# Patient Record
Sex: Female | Born: 1935 | Race: White | Hispanic: No | Smoking: Former smoker
Health system: Southern US, Community
[De-identification: ages and names within clinical notes are randomized; demographics above are authoritative.]

## PROBLEM LIST (undated history)

## (undated) DIAGNOSIS — I10 Essential (primary) hypertension: Secondary | ICD-10-CM

## (undated) DIAGNOSIS — T8859XA Other complications of anesthesia, initial encounter: Secondary | ICD-10-CM

## (undated) DIAGNOSIS — I4891 Unspecified atrial fibrillation: Secondary | ICD-10-CM

## (undated) DIAGNOSIS — I509 Heart failure, unspecified: Secondary | ICD-10-CM

## (undated) DIAGNOSIS — I639 Cerebral infarction, unspecified: Secondary | ICD-10-CM

## (undated) DIAGNOSIS — M069 Rheumatoid arthritis, unspecified: Secondary | ICD-10-CM

## (undated) HISTORY — PX: JOINT REPLACEMENT: SHX530

---

## 2020-10-04 ENCOUNTER — Emergency Department (HOSPITAL_COMMUNITY): Payer: Medicare PPO

## 2020-10-04 ENCOUNTER — Other Ambulatory Visit: Payer: Self-pay

## 2020-10-04 ENCOUNTER — Inpatient Hospital Stay (HOSPITAL_COMMUNITY)
Admission: EM | Admit: 2020-10-04 | Discharge: 2020-10-08 | DRG: 521 | Disposition: A | Discharge status: Home Health | Payer: Medicare PPO | Attending: Internal Medicine | Admitting: Internal Medicine

## 2020-10-04 ENCOUNTER — Encounter (HOSPITAL_COMMUNITY): Payer: Self-pay

## 2020-10-04 DIAGNOSIS — D649 Anemia, unspecified: Secondary | ICD-10-CM | POA: Diagnosis not present

## 2020-10-04 DIAGNOSIS — Z8673 Personal history of transient ischemic attack (TIA), and cerebral infarction without residual deficits: Secondary | ICD-10-CM

## 2020-10-04 DIAGNOSIS — Z20822 Contact with and (suspected) exposure to covid-19: Secondary | ICD-10-CM | POA: Diagnosis present

## 2020-10-04 DIAGNOSIS — I447 Left bundle-branch block, unspecified: Secondary | ICD-10-CM | POA: Diagnosis present

## 2020-10-04 DIAGNOSIS — Z419 Encounter for procedure for purposes other than remedying health state, unspecified: Secondary | ICD-10-CM

## 2020-10-04 DIAGNOSIS — W010XXA Fall on same level from slipping, tripping and stumbling without subsequent striking against object, initial encounter: Secondary | ICD-10-CM | POA: Diagnosis present

## 2020-10-04 DIAGNOSIS — H9190 Unspecified hearing loss, unspecified ear: Secondary | ICD-10-CM | POA: Diagnosis present

## 2020-10-04 DIAGNOSIS — Z974 Presence of external hearing-aid: Secondary | ICD-10-CM

## 2020-10-04 DIAGNOSIS — I110 Hypertensive heart disease with heart failure: Secondary | ICD-10-CM | POA: Diagnosis present

## 2020-10-04 DIAGNOSIS — Z87891 Personal history of nicotine dependence: Secondary | ICD-10-CM

## 2020-10-04 DIAGNOSIS — S72001A Fracture of unspecified part of neck of right femur, initial encounter for closed fracture: Principal | ICD-10-CM | POA: Diagnosis present

## 2020-10-04 DIAGNOSIS — I509 Heart failure, unspecified: Secondary | ICD-10-CM | POA: Diagnosis present

## 2020-10-04 DIAGNOSIS — M069 Rheumatoid arthritis, unspecified: Secondary | ICD-10-CM | POA: Diagnosis present

## 2020-10-04 DIAGNOSIS — W1830XA Fall on same level, unspecified, initial encounter: Secondary | ICD-10-CM | POA: Diagnosis present

## 2020-10-04 DIAGNOSIS — K5730 Diverticulosis of large intestine without perforation or abscess without bleeding: Secondary | ICD-10-CM | POA: Diagnosis present

## 2020-10-04 DIAGNOSIS — S0003XA Contusion of scalp, initial encounter: Secondary | ICD-10-CM | POA: Diagnosis present

## 2020-10-04 DIAGNOSIS — R296 Repeated falls: Secondary | ICD-10-CM | POA: Diagnosis present

## 2020-10-04 DIAGNOSIS — Z79899 Other long term (current) drug therapy: Secondary | ICD-10-CM

## 2020-10-04 DIAGNOSIS — E43 Unspecified severe protein-calorie malnutrition: Secondary | ICD-10-CM | POA: Diagnosis present

## 2020-10-04 DIAGNOSIS — Z885 Allergy status to narcotic agent status: Secondary | ICD-10-CM

## 2020-10-04 DIAGNOSIS — M1611 Unilateral primary osteoarthritis, right hip: Secondary | ICD-10-CM | POA: Diagnosis present

## 2020-10-04 DIAGNOSIS — I480 Paroxysmal atrial fibrillation: Secondary | ICD-10-CM | POA: Diagnosis present

## 2020-10-04 DIAGNOSIS — Z91013 Allergy to seafood: Secondary | ICD-10-CM

## 2020-10-04 DIAGNOSIS — Z681 Body mass index (BMI) 19 or less, adult: Secondary | ICD-10-CM

## 2020-10-04 DIAGNOSIS — Z96641 Presence of right artificial hip joint: Secondary | ICD-10-CM | POA: Diagnosis not present

## 2020-10-04 DIAGNOSIS — I482 Chronic atrial fibrillation, unspecified: Secondary | ICD-10-CM | POA: Diagnosis present

## 2020-10-04 DIAGNOSIS — I10 Essential (primary) hypertension: Secondary | ICD-10-CM | POA: Diagnosis present

## 2020-10-04 DIAGNOSIS — S0101XA Laceration without foreign body of scalp, initial encounter: Secondary | ICD-10-CM | POA: Diagnosis present

## 2020-10-04 HISTORY — DX: Essential (primary) hypertension: I10

## 2020-10-04 HISTORY — DX: Rheumatoid arthritis, unspecified: M06.9

## 2020-10-04 HISTORY — DX: Unspecified atrial fibrillation: I48.91

## 2020-10-04 HISTORY — DX: Cerebral infarction, unspecified: I63.9

## 2020-10-04 HISTORY — DX: Heart failure, unspecified: I50.9

## 2020-10-04 HISTORY — DX: Other complications of anesthesia, initial encounter: T88.59XA

## 2020-10-04 MED ORDER — ACETAMINOPHEN 325 MG PO TABS
650.0000 mg | ORAL_TABLET | Freq: Once | ORAL | Status: AC
  Administered 2020-10-04: 650 mg via ORAL
  Filled 2020-10-04: qty 2

## 2020-10-04 NOTE — ED Triage Notes (Signed)
Pt had witnessed mechanical fall while trying to remove her shoes. Hit her head. Denies LOC. Laceration on back of head.  Pt was ambulatory when EMS arrived.

## 2020-10-04 NOTE — ED Notes (Signed)
Patient transported to X-ray 

## 2020-10-05 ENCOUNTER — Emergency Department (HOSPITAL_COMMUNITY): Payer: Medicare PPO

## 2020-10-05 ENCOUNTER — Encounter (HOSPITAL_COMMUNITY): Payer: Self-pay | Admitting: Internal Medicine

## 2020-10-05 DIAGNOSIS — S0003XA Contusion of scalp, initial encounter: Secondary | ICD-10-CM | POA: Diagnosis not present

## 2020-10-05 DIAGNOSIS — E43 Unspecified severe protein-calorie malnutrition: Secondary | ICD-10-CM | POA: Diagnosis present

## 2020-10-05 DIAGNOSIS — Z91013 Allergy to seafood: Secondary | ICD-10-CM | POA: Diagnosis not present

## 2020-10-05 DIAGNOSIS — R296 Repeated falls: Secondary | ICD-10-CM | POA: Diagnosis present

## 2020-10-05 DIAGNOSIS — M069 Rheumatoid arthritis, unspecified: Secondary | ICD-10-CM | POA: Diagnosis present

## 2020-10-05 DIAGNOSIS — I10 Essential (primary) hypertension: Secondary | ICD-10-CM

## 2020-10-05 DIAGNOSIS — I110 Hypertensive heart disease with heart failure: Secondary | ICD-10-CM | POA: Diagnosis present

## 2020-10-05 DIAGNOSIS — Z79899 Other long term (current) drug therapy: Secondary | ICD-10-CM | POA: Diagnosis not present

## 2020-10-05 DIAGNOSIS — Z20822 Contact with and (suspected) exposure to covid-19: Secondary | ICD-10-CM | POA: Diagnosis present

## 2020-10-05 DIAGNOSIS — I480 Paroxysmal atrial fibrillation: Secondary | ICD-10-CM | POA: Diagnosis present

## 2020-10-05 DIAGNOSIS — W1830XA Fall on same level, unspecified, initial encounter: Secondary | ICD-10-CM

## 2020-10-05 DIAGNOSIS — S72001A Fracture of unspecified part of neck of right femur, initial encounter for closed fracture: Secondary | ICD-10-CM | POA: Diagnosis present

## 2020-10-05 DIAGNOSIS — I447 Left bundle-branch block, unspecified: Secondary | ICD-10-CM | POA: Diagnosis present

## 2020-10-05 DIAGNOSIS — Z87891 Personal history of nicotine dependence: Secondary | ICD-10-CM | POA: Diagnosis not present

## 2020-10-05 DIAGNOSIS — Z8673 Personal history of transient ischemic attack (TIA), and cerebral infarction without residual deficits: Secondary | ICD-10-CM | POA: Diagnosis not present

## 2020-10-05 DIAGNOSIS — I482 Chronic atrial fibrillation, unspecified: Secondary | ICD-10-CM | POA: Diagnosis not present

## 2020-10-05 DIAGNOSIS — Z681 Body mass index (BMI) 19 or less, adult: Secondary | ICD-10-CM | POA: Diagnosis not present

## 2020-10-05 DIAGNOSIS — W010XXA Fall on same level from slipping, tripping and stumbling without subsequent striking against object, initial encounter: Secondary | ICD-10-CM | POA: Diagnosis present

## 2020-10-05 DIAGNOSIS — S0101XA Laceration without foreign body of scalp, initial encounter: Secondary | ICD-10-CM | POA: Diagnosis present

## 2020-10-05 DIAGNOSIS — M1611 Unilateral primary osteoarthritis, right hip: Secondary | ICD-10-CM | POA: Diagnosis present

## 2020-10-05 DIAGNOSIS — D649 Anemia, unspecified: Secondary | ICD-10-CM | POA: Diagnosis not present

## 2020-10-05 DIAGNOSIS — Z974 Presence of external hearing-aid: Secondary | ICD-10-CM | POA: Diagnosis not present

## 2020-10-05 DIAGNOSIS — H9190 Unspecified hearing loss, unspecified ear: Secondary | ICD-10-CM | POA: Diagnosis present

## 2020-10-05 DIAGNOSIS — I509 Heart failure, unspecified: Secondary | ICD-10-CM | POA: Diagnosis present

## 2020-10-05 DIAGNOSIS — Z885 Allergy status to narcotic agent status: Secondary | ICD-10-CM | POA: Diagnosis not present

## 2020-10-05 DIAGNOSIS — K5730 Diverticulosis of large intestine without perforation or abscess without bleeding: Secondary | ICD-10-CM | POA: Diagnosis present

## 2020-10-05 LAB — CBC WITH DIFFERENTIAL/PLATELET
Abs Immature Granulocytes: 0.01 10*3/uL (ref 0.00–0.07)
Basophils Absolute: 0 10*3/uL (ref 0.0–0.1)
Basophils Relative: 0 %
Eosinophils Absolute: 0 10*3/uL (ref 0.0–0.5)
Eosinophils Relative: 0 %
HCT: 36.1 % (ref 36.0–46.0)
Hemoglobin: 11.6 g/dL — ABNORMAL LOW (ref 12.0–15.0)
Immature Granulocytes: 0 %
Lymphocytes Relative: 29 %
Lymphs Abs: 2.1 10*3/uL (ref 0.7–4.0)
MCH: 31.4 pg (ref 26.0–34.0)
MCHC: 32.1 g/dL (ref 30.0–36.0)
MCV: 97.8 fL (ref 80.0–100.0)
Monocytes Absolute: 0.5 10*3/uL (ref 0.1–1.0)
Monocytes Relative: 6 %
Neutro Abs: 4.8 10*3/uL (ref 1.7–7.7)
Neutrophils Relative %: 65 %
Platelets: 175 10*3/uL (ref 150–400)
RBC: 3.69 MIL/uL — ABNORMAL LOW (ref 3.87–5.11)
RDW: 14.7 % (ref 11.5–15.5)
WBC: 7.4 10*3/uL (ref 4.0–10.5)
nRBC: 0 % (ref 0.0–0.2)

## 2020-10-05 LAB — RESP PANEL BY RT-PCR (FLU A&B, COVID) ARPGX2
Influenza A by PCR: NEGATIVE
Influenza B by PCR: NEGATIVE
SARS Coronavirus 2 by RT PCR: NEGATIVE

## 2020-10-05 LAB — BASIC METABOLIC PANEL
Anion gap: 11 (ref 5–15)
BUN: 29 mg/dL — ABNORMAL HIGH (ref 8–23)
CO2: 26 mmol/L (ref 22–32)
Calcium: 8.4 mg/dL — ABNORMAL LOW (ref 8.9–10.3)
Chloride: 100 mmol/L (ref 98–111)
Creatinine, Ser: 0.84 mg/dL (ref 0.44–1.00)
GFR, Estimated: >60 mL/min (ref >60)
Glucose, Bld: 122 mg/dL — ABNORMAL HIGH (ref 70–99)
Potassium: 4.5 mmol/L (ref 3.5–5.1)
Sodium: 137 mmol/L (ref 135–145)

## 2020-10-05 LAB — PROTIME-INR
INR: 1.1 (ref 0.8–1.2)
Prothrombin Time: 14 seconds (ref 11.4–15.2)

## 2020-10-05 MED ORDER — ONDANSETRON HCL 4 MG/2ML IJ SOLN: 4.0000 mg | Freq: Four times a day (QID) | INTRAMUSCULAR | Status: DC | PRN

## 2020-10-05 MED ORDER — ATORVASTATIN CALCIUM 20 MG PO TABS
20.0000 mg | ORAL_TABLET | Freq: Every day | ORAL | Status: DC
  Administered 2020-10-05 – 2020-10-07 (×3): 20 mg via ORAL
  Filled 2020-10-05 (×3): qty 1

## 2020-10-05 MED ORDER — MIRTAZAPINE 15 MG PO TABS
7.5000 mg | ORAL_TABLET | Freq: Every day | ORAL | Status: DC
  Administered 2020-10-05 – 2020-10-07 (×3): 7.5 mg via ORAL
  Filled 2020-10-05 (×3): qty 1

## 2020-10-05 MED ORDER — ACETAMINOPHEN 650 MG RE SUPP: 650.0000 mg | Freq: Four times a day (QID) | RECTAL | Status: DC | PRN

## 2020-10-05 MED ORDER — ONDANSETRON HCL 4 MG PO TABS: 4.0000 mg | ORAL_TABLET | Freq: Four times a day (QID) | ORAL | Status: DC | PRN

## 2020-10-05 MED ORDER — ACETAMINOPHEN 325 MG PO TABS: 650.0000 mg | ORAL_TABLET | Freq: Four times a day (QID) | ORAL | Status: DC | PRN

## 2020-10-05 MED ORDER — METOPROLOL TARTRATE 50 MG PO TABS
100.0000 mg | ORAL_TABLET | Freq: Two times a day (BID) | ORAL | Status: DC
  Administered 2020-10-05 (×2): 100 mg via ORAL
  Filled 2020-10-05 (×3): qty 2

## 2020-10-05 NOTE — H&P (Signed)
History and Physical  Patient Name: Kristen Lamb     RUE:[id number]    DOB: 1929-09-29    DOA: 10/04/2020 PCP: No primary care provider on file.  Patient coming from: Home  Chief Complaint: Fall    HPI: Kristen Lamb is a 87 y.o. female, with PMH of RA, hypertension, CVA, paroxysmal A. fib (not on anticoagulation), CHF, who presented to the ER on 10/05/2020 with fall and was found to have head laceration and right hip fracture.  Patient was at her daughter's house when she had a mechanical fall, falling backwards while trying to remove her shoe and hit her head and landed on the right side of her body.  She has had a few falls over the past several months.  She had bleeding in the back of her head.  Denies loss of consciousness.  She is not on anticoagulation for A. fib.  It appears she is possibly on Plavix. She lives in [place] but was visiting her daughter as her daughter has been having some mental issues.  She was able to walk to walk after the incident.  No significant pain.  Of note she is follow-up with an orthopedic in her hometown for arthritis and there was tentative plans for possible hip replacement.  Due to fall and bleeding from the back of her head, she presented to the ER for evaluation.    ED course: -Vitals on admission: Heart rate 68, respiratory rate 17, blood pressure 173/75, maintaining sats on room air -Labs on initial presentation: Sodium 137, potassium 4.5, chloride 100, bicarb 26, glucose 122, BUN 29, creatinine 0.84, calcium 8.4, hemoglobin 11.6, WBC 7.4 -Imaging obtained on admission:  Imaging demonstrates right femoral neck fracture.  CT of the head demonstrated scalp hematoma and laceration. -In the ED the patient was given Tylenol, and the hospitalist service was contacted for further evaluation and management.  However upon further discussion with patient, she does not want to have surgery here, wants to be [place] in [place] hometown.  ER aware.  ER plans to  contact family to discuss this morning.  If they cannot convince her to have surgery done, ER will provide her with [facility] paperwork.     ROS: A complete and thorough 12 point review of systems obtained, negative listed in HPI.     Past Medical History:  Diagnosis Date  . A-fib (HCC)   . CHF (congestive heart failure) (HCC)   . Hypertension   . Stroke [facility])       Social History: Patient lives at home.  The patient walks without assistance.  nonsmoker.  Allergies  Allergen Reactions  . Morphine And Related   . Shellfish Allergy     Family history: family history is not on file.  Prior to Admission medications   Not on File       Physical Exam: BP (!) 173/75   Pulse 68   Resp 17   SpO2 98%   General appearance: Well-developed, adult female, alert and in no acute distress . Eyes: Anicteric, conjunctiva pink, lids and lashes normal. PERRL.    ENT: No nasal deformity, discharge, epistaxis.  Hard of hearing. OP moist without lesions.    Posterior scalp laceration with staples in place, no active bleeding Neck: No neck masses.  Trachea midline.  No thyromegaly/tenderness. Lymph: No cervical or supraclavicular lymphadenopathy. Skin: Warm and dry.  No jaundice.  No suspicious rashes or lesions. Cardiac: RRR, nl S1-S2, no murmurs appreciated.  No LE edema.  Radial  and pedal pulses 2+ and symmetric. Respiratory: Normal respiratory rate and rhythm.  CTAB without rales or wheezes. Abdomen: Abdomen soft.  No tenderness with palpation. No ascites, distension, hepatosplenomegaly.   MSK:  Rheumatoid arthritis changes noted in her hands. Neuro: Cranial nerves 2 through 12 grossly intact.  Sensation intact to light touch. Speech is fluent.    Psych: Sensorium intact and responding to questions, attention normal.  Behavior appropriate.  Judgment and insight appear normal.    Labs on Admission:  I have personally reviewed following labs and imaging studies: CBC: Recent Labs  Lab  10/05/20 0022  WBC 7.4  NEUTROABS 4.8  HGB 11.6*  HCT 36.1  MCV 97.8  PLT 175   Basic Metabolic Panel: Recent Labs  Lab 10/05/20 0022  NA 137  K 4.5  CL 100  CO2 26  GLUCOSE 122*  BUN 29*  CREATININE 0.84  CALCIUM 8.4*   GFR: CrCl cannot be calculated (Unknown ideal weight.).  Liver Function Tests: No results for input(s): AST, ALT, ALKPHOS, BILITOT, PROT, ALBUMIN in the last 168 hours. No results for input(s): LIPASE, AMYLASE in the last 168 hours. No results for input(s): AMMONIA in the last 168 hours. Coagulation Profile: Recent Labs  Lab 10/05/20 0022  INR 1.1   Cardiac Enzymes: No results for input(s): CKTOTAL, CKMB, CKMBINDEX, TROPONINI in the last 168 hours. BNP (last 3 results) No results for input(s): PROBNP in the last 8760 hours. HbA1C: No results for input(s): HGBA1C in the last 72 hours. CBG: No results for input(s): GLUCAP in the last 168 hours. Lipid Profile: No results for input(s): CHOL, HDL, LDLCALC, TRIG, CHOLHDL, LDLDIRECT in the last 72 hours. Thyroid Function Tests: No results for input(s): TSH, T4TOTAL, FREET4, T3FREE, THYROIDAB in the last 72 hours. Anemia Panel: No results for input(s): VITAMINB12, FOLATE, FERRITIN, TIBC, IRON, RETICCTPCT in the last 72 hours.   No results found for this or any previous visit (from the past 240 hour(s)).         Radiological Exams on Admission: Personally reviewed imaging which shows: Imaging demonstrates right femoral neck fracture.  CT of the head demonstrated scalp hematoma and laceration. DG Chest 1 View  Result Date: 10/04/2020 CLINICAL DATA:  Fall EXAM: CHEST  1 VIEW COMPARISON:  None. FINDINGS: There is hyperinflation of the lungs compatible with COPD. Cardiomegaly. Calcified granulomas in the lungs. No confluent opacity or effusion. No acute bony abnormality. Biapical scarring. IMPRESSION: COPD/chronic changes. Cardiomegaly. No active disease. Electronically Signed   By: Charlett Nose M.D.    On: 10/04/2020 23:51   DG Pelvis 1-2 Views  Result Date: 10/04/2020 CLINICAL DATA:  Fall, right hip pain EXAM: PELVIS - 1-2 VIEW COMPARISON:  None. FINDINGS: Right femoral neck fracture noted. No subluxation or dislocation. Mild degenerative changes in the hips bilaterally. SI joints symmetric and unremarkable. IMPRESSION: Right femoral neck fracture. Electronically Signed   By: Charlett Nose M.D.   On: 10/04/2020 23:52   CT Head Wo Contrast  Result Date: 10/05/2020 CLINICAL DATA:  Witnessed mechanical fall with positive head strike, no loss of consciousness, posterior scalp laceration EXAM: CT HEAD WITHOUT CONTRAST CT CERVICAL SPINE WITHOUT CONTRAST TECHNIQUE: Multidetector CT imaging of the head and cervical spine was performed following the standard protocol without intravenous contrast. Multiplanar CT image reconstructions of the cervical spine were also generated. COMPARISON:  None. FINDINGS: CT HEAD FINDINGS Brain: No evidence of acute infarction, hemorrhage, hydrocephalus, extra-axial collection, visible mass lesion or mass effect. Symmetric prominence of the ventricles,  cisterns and sulci compatible with parenchymal volume loss. Patchy areas of white matter hypoattenuation are most compatible with chronic microvascular angiopathy. Vascular: Atherosclerotic calcification of the carotid siphons and intradural vertebral arteries. No hyperdense vessel. Skull: Right parietal scalp swelling and hematoma with overlying surgical staple repair of an a laceration. Crescentic scalp hematoma measures up to a maximal 6 mm in thickness. No subjacent calvarial fracture or other acute osseous injury is seen. No other significant sites of scalp swelling or hematoma. Sinuses/Orbits: Round calcification in the right orbit appears to be extraconal, without particularly aggressive or worrisome features. Bilateral lens extractions. Orbital structures are otherwise unremarkable. Paranasal sinuses and mastoid air cells are  predominantly clear albeit with some asymmetric hypopneumatization of the left mastoid. Other: Bilateral TMJ arthrosis CT CERVICAL SPINE FINDINGS Alignment: Stabilization collar is absent at the time of examination. Mild levoconvex curvature of the cervical spine. No evidence of traumatic listhesis. No abnormally widened, perched or jumped facets. Normal alignment of the craniocervical and atlantoaxial articulations. Bony fusion of the left C5-6 articular facets. Skull base and vertebrae: No acute skull base fracture. No vertebral body fracture or height loss. Normal bone mineralization. No worrisome osseous lesions. Moderate arthrosis at the atlantodental interval Soft tissues and spinal canal: No pre or paravertebral fluid or swelling. No visible canal hematoma. Airways patent. Cervical carotid and vertebral artery atherosclerosis. Disc levels: Multilevel intervertebral disc height loss with spondylitic endplate changes. Posterior disc osteophyte complexes throughout the cervical spine partially efface the ventral thecal sac without significant central cord impingement. Multilevel uncinate spurring facet hypertrophic changes are present throughout the cervical levels as well resulting in mild-to-moderate multilevel neural foraminal narrowing with more moderate to severe narrowing on the right C3-4 and left C4-5. Upper chest: Extensive biapical pleuroparenchymal scarring calcification. Calcifications in the proximal great vessels as well. Diminutive thyroid without concerning nodules or masses. Other: None. IMPRESSION: 1. Right parietal scalp swelling and hematoma with overlying surgical staple repair of an laceration. Crescentic scalp hematoma measures up to a maximal 6 mm in thickness. No subjacent calvarial fracture or other acute osseous injury is seen. 2. No acute intracranial abnormality. 3. Nonspecific calcification in the posterior right orbit without aggressive or worrisome features. 4. No acute fracture  or traumatic listhesis of the cervical spine. 5. Multilevel degenerative changes of the cervical spine as described above. 6. Cervical and intracranial atherosclerosis. Electronically Signed   By: Kreg Shropshire M.D.   On: 10/05/2020 00:39   CT Cervical Spine Wo Contrast  Result Date: 10/05/2020 CLINICAL DATA:  Witnessed mechanical fall with positive head strike, no loss of consciousness, posterior scalp laceration EXAM: CT HEAD WITHOUT CONTRAST CT CERVICAL SPINE WITHOUT CONTRAST TECHNIQUE: Multidetector CT imaging of the head and cervical spine was performed following the standard protocol without intravenous contrast. Multiplanar CT image reconstructions of the cervical spine were also generated. COMPARISON:  None. FINDINGS: CT HEAD FINDINGS Brain: No evidence of acute infarction, hemorrhage, hydrocephalus, extra-axial collection, visible mass lesion or mass effect. Symmetric prominence of the ventricles, cisterns and sulci compatible with parenchymal volume loss. Patchy areas of white matter hypoattenuation are most compatible with chronic microvascular angiopathy. Vascular: Atherosclerotic calcification of the carotid siphons and intradural vertebral arteries. No hyperdense vessel. Skull: Right parietal scalp swelling and hematoma with overlying surgical staple repair of an a laceration. Crescentic scalp hematoma measures up to a maximal 6 mm in thickness. No subjacent calvarial fracture or other acute osseous injury is seen. No other significant sites of scalp swelling or hematoma. Sinuses/Orbits:  Round calcification in the right orbit appears to be extraconal, without particularly aggressive or worrisome features. Bilateral lens extractions. Orbital structures are otherwise unremarkable. Paranasal sinuses and mastoid air cells are predominantly clear albeit with some asymmetric hypopneumatization of the left mastoid. Other: Bilateral TMJ arthrosis CT CERVICAL SPINE FINDINGS Alignment: Stabilization collar is  absent at the time of examination. Mild levoconvex curvature of the cervical spine. No evidence of traumatic listhesis. No abnormally widened, perched or jumped facets. Normal alignment of the craniocervical and atlantoaxial articulations. Bony fusion of the left C5-6 articular facets. Skull base and vertebrae: No acute skull base fracture. No vertebral body fracture or height loss. Normal bone mineralization. No worrisome osseous lesions. Moderate arthrosis at the atlantodental interval Soft tissues and spinal canal: No pre or paravertebral fluid or swelling. No visible canal hematoma. Airways patent. Cervical carotid and vertebral artery atherosclerosis. Disc levels: Multilevel intervertebral disc height loss with spondylitic endplate changes. Posterior disc osteophyte complexes throughout the cervical spine partially efface the ventral thecal sac without significant central cord impingement. Multilevel uncinate spurring facet hypertrophic changes are present throughout the cervical levels as well resulting in mild-to-moderate multilevel neural foraminal narrowing with more moderate to severe narrowing on the right C3-4 and left C4-5. Upper chest: Extensive biapical pleuroparenchymal scarring calcification. Calcifications in the proximal great vessels as well. Diminutive thyroid without concerning nodules or masses. Other: None. IMPRESSION: 1. Right parietal scalp swelling and hematoma with overlying surgical staple repair of an laceration. Crescentic scalp hematoma measures up to a maximal 6 mm in thickness. No subjacent calvarial fracture or other acute osseous injury is seen. 2. No acute intracranial abnormality. 3. Nonspecific calcification in the posterior right orbit without aggressive or worrisome features. 4. No acute fracture or traumatic listhesis of the cervical spine. 5. Multilevel degenerative changes of the cervical spine as described above. 6. Cervical and intracranial atherosclerosis.  Electronically Signed   By: Kreg Shropshire M.D.   On: 10/05/2020 00:39   CT Hip Right Wo Contrast  Result Date: 10/05/2020 CLINICAL DATA:  87 year old female with concern for right hip fracture. EXAM: CT OF THE RIGHT HIP WITHOUT CONTRAST TECHNIQUE: Multidetector CT imaging of the right hip was performed according to the standard protocol. Multiplanar CT image reconstructions were also generated. COMPARISON:  Pelvic radiograph dated 10/04/2020. FINDINGS: Bones/Joint/Cartilage There is a nondisplaced fracture of the right femoral neck with mild impaction. The bones are osteopenic. No dislocation. Mild arthritic changes of the right hip. No joint effusion. Ligaments Suboptimally assessed by CT. Muscles and Tendons No acute findings.  No intramuscular fluid collection or hematoma. Soft tissues Diffuse colonic diverticulosis. The remainder of the soft tissues are unremarkable. IMPRESSION: Nondisplaced fracture of the right femoral neck. Electronically Signed   By: Elgie Collard M.D.   On: 10/05/2020 02:38         Assessment/Plan   1. S/P GLF with resultant scalp laceration and hematoma and right hip fracture -CT the head on admission showed scalp laceration and hematoma -Fall precautions -Treatment of right hip fracture as below  2.  Right femoral neck fracture, closed -Imaging on admission demonstrated right femoral neck fracture -Orthopedics consulted by the ED. plan was for surgery today -N.p.o. -Pain control is warranted -After talking the patient, she does not want to have surgery done here and [place].  ER provider aware.  They will call family in the morning to see if they can convince her if not and they pick her up,  ER will get her sign AMA paperwork   3.  Paroxysmal A. fib -Not on anticoagulation -Rate controlled.  Awaiting home medication reconciliation  4.  Posterior scalp hematoma -CT head on admission showed scalp laceration hematoma -ER provider  stapled in the ED.  No signs of active bleeding  5.  Essential hypertension -Blood pressure currently slightly above goal.  Will wait until home medication reconciliation to restart home meds      DVT prophylaxis: None given plans of doing Code Status: Full Disposition Plan: As a now patient appears unwilling to do surgery wants to sign out AMA however pending discussion with family in the morning Consults called: Orthopedics called by ER provider Admission status: Inpatient     Medical decision making: Patient seen at 3:09 AM on 10/05/2020.  The patient was discussed with ER provider.  What exists of the patient's chart was reviewed in depth and summarized above.  Clinical condition: Fair.        Laqueta Due [facility] Please page though AMION or Epic secure chat:  For password, contact charge nurse

## 2020-10-05 NOTE — ED Notes (Signed)
ED TO INPATIENT HANDOFF REPORT  Name/Age/Gender Kristen Lamb 87 y.o. female  Code Status   Home/SNF/Other Home  Chief Complaint Closed right hip fracture, initial encounter (HCC) [S72.001A]  Level of Care/Admitting Diagnosis ED Disposition    ED Disposition Condition Comment   Admit  Hospital Area: [facility] [[id number]]  Level of Care: Med-Surg [16]  May admit patient to [facility] or [facility] if equivalent level of care is available:: Yes  Covid Evaluation: Asymptomatic Screening Protocol (No Symptoms)  Diagnosis: Closed right hip fracture, initial encounter [facility]) [[id number]]  Admitting Physician: Laqueta Due [[id number]]  Attending Physician: Laqueta Due [[id number]]  Estimated length of stay: 3 - 4 days  Certification:: I certify this patient will need inpatient services for at least 2 midnights       Medical History Past Medical History:  Diagnosis Date  . A-fib (HCC)   . CHF (congestive heart failure) (HCC)   . Hypertension   . Rheumatoid arthritis (HCC)   . Stroke [facility])     Allergies Allergies  Allergen Reactions  . Morphine And Related   . Shellfish Allergy     IV Location/Drains/Wounds Patient Lines/Drains/Airways Status    Active Line/Drains/Airways    None          Labs/Imaging Results for orders placed or performed during the hospital encounter of 10/04/20 (from the past 48 hour(s))  CBC with Differential/Platelet     Status: Abnormal   Collection Time: 10/05/20 12:22 AM  Result Value Ref Range   WBC 7.4 4.0 - 10.5 K/uL   RBC 3.69 (L) 3.87 - 5.11 MIL/uL   Hemoglobin 11.6 (L) 12.0 - 15.0 g/dL   HCT 84.1 32.4 - 40.1 %   MCV 97.8 80.0 - 100.0 fL   MCH 31.4 26.0 - 34.0 pg   MCHC 32.1 30.0 - 36.0 g/dL   RDW 02.7 25.3 - 66.4 %   Platelets 175 150 - 400 K/uL   nRBC 0.0 0.0 - 0.2 %   Neutrophils Relative % 65 %   Neutro Abs 4.8 1.7 - 7.7 K/uL   Lymphocytes Relative 29 %   Lymphs Abs 2.1 0.7 - 4.0 K/uL    Monocytes Relative 6 %   Monocytes Absolute 0.5 0.1 - 1.0 K/uL   Eosinophils Relative 0 %   Eosinophils Absolute 0.0 0.0 - 0.5 K/uL   Basophils Relative 0 %   Basophils Absolute 0.0 0.0 - 0.1 K/uL   Immature Granulocytes 0 %   Abs Immature Granulocytes 0.01 0.00 - 0.07 K/uL    Comment: Performed at [facility], [address], [place], [place] [address]  Basic metabolic panel     Status: Abnormal   Collection Time: 10/05/20 12:22 AM  Result Value Ref Range   Sodium 137 135 - 145 mmol/L   Potassium 4.5 3.5 - 5.1 mmol/L   Chloride 100 98 - 111 mmol/L   CO2 26 22 - 32 mmol/L   Glucose, Bld 122 (H) 70 - 99 mg/dL    Comment: Glucose reference range applies only to samples taken after fasting for at least 8 hours.   BUN 29 (H) 8 - 23 mg/dL   Creatinine, Ser 4.25 0.44 - 1.00 mg/dL   Calcium 8.4 (L) 8.9 - 10.3 mg/dL   GFR, Estimated >95 >63 mL/min    Comment: (NOTE) Calculated using the CKD-EPI Creatinine Equation (2021)    Anion gap 11 5 - 15    Comment: Performed at [facility], [address], [place], [place] [address]  Protime-INR     Status: None   Collection Time: 10/05/20 12:22 AM  Result Value Ref Range   Prothrombin Time 14.0 11.4 - 15.2 seconds   INR 1.1 0.8 - 1.2    Comment: (NOTE) INR goal varies based on device and disease states. Performed at [facility], [address] [place], [place] [address]    DG Chest 1 View  Result Date: 10/04/2020 CLINICAL DATA:  Fall EXAM: CHEST  1 VIEW COMPARISON:  None. FINDINGS: There is hyperinflation of the lungs compatible with COPD. Cardiomegaly. Calcified granulomas in the lungs. No confluent opacity or effusion. No acute bony abnormality. Biapical scarring. IMPRESSION: COPD/chronic changes. Cardiomegaly. No active disease. Electronically Signed   By: Charlett Nose M.D.   On: 10/04/2020 23:51   DG Pelvis 1-2 Views  Result Date: 10/04/2020 CLINICAL DATA:  Fall, right hip  pain EXAM: PELVIS - 1-2 VIEW COMPARISON:  None. FINDINGS: Right femoral neck fracture noted. No subluxation or dislocation. Mild degenerative changes in the hips bilaterally. SI joints symmetric and unremarkable. IMPRESSION: Right femoral neck fracture. Electronically Signed   By: Charlett Nose M.D.   On: 10/04/2020 23:52   CT Head Wo Contrast  Result Date: 10/05/2020 CLINICAL DATA:  Witnessed mechanical fall with positive head strike, no loss of consciousness, posterior scalp laceration EXAM: CT HEAD WITHOUT CONTRAST CT CERVICAL SPINE WITHOUT CONTRAST TECHNIQUE: Multidetector CT imaging of the head and cervical spine was performed following the standard protocol without intravenous contrast. Multiplanar CT image reconstructions of the cervical spine were also generated. COMPARISON:  None. FINDINGS: CT HEAD FINDINGS Brain: No evidence of acute infarction, hemorrhage, hydrocephalus, extra-axial collection, visible mass lesion or mass effect. Symmetric prominence of the ventricles, cisterns and sulci compatible with parenchymal volume loss. Patchy areas of white matter hypoattenuation are most compatible with chronic microvascular angiopathy. Vascular: Atherosclerotic calcification of the carotid siphons and intradural vertebral arteries. No hyperdense vessel. Skull: Right parietal scalp swelling and hematoma with overlying surgical staple repair of an a laceration. Crescentic scalp hematoma measures up to a maximal 6 mm in thickness. No subjacent calvarial fracture or other acute osseous injury is seen. No other significant sites of scalp swelling or hematoma. Sinuses/Orbits: Round calcification in the right orbit appears to be extraconal, without particularly aggressive or worrisome features. Bilateral lens extractions. Orbital structures are otherwise unremarkable. Paranasal sinuses and mastoid air cells are predominantly clear albeit with some asymmetric hypopneumatization of the left mastoid. Other: Bilateral  TMJ arthrosis CT CERVICAL SPINE FINDINGS Alignment: Stabilization collar is absent at the time of examination. Mild levoconvex curvature of the cervical spine. No evidence of traumatic listhesis. No abnormally widened, perched or jumped facets. Normal alignment of the craniocervical and atlantoaxial articulations. Bony fusion of the left C5-6 articular facets. Skull base and vertebrae: No acute skull base fracture. No vertebral body fracture or height loss. Normal bone mineralization. No worrisome osseous lesions. Moderate arthrosis at the atlantodental interval Soft tissues and spinal canal: No pre or paravertebral fluid or swelling. No visible canal hematoma. Airways patent. Cervical carotid and vertebral artery atherosclerosis. Disc levels: Multilevel intervertebral disc height loss with spondylitic endplate changes. Posterior disc osteophyte complexes throughout the cervical spine partially efface the ventral thecal sac without significant central cord impingement. Multilevel uncinate spurring facet hypertrophic changes are present throughout the cervical levels as well resulting in mild-to-moderate multilevel neural foraminal narrowing with more moderate to severe narrowing on the right C3-4 and  left C4-5. Upper chest: Extensive biapical pleuroparenchymal scarring calcification. Calcifications in the proximal great vessels as well. Diminutive thyroid without concerning nodules or masses. Other: None. IMPRESSION: 1. Right parietal scalp swelling and hematoma with overlying surgical staple repair of an laceration. Crescentic scalp hematoma measures up to a maximal 6 mm in thickness. No subjacent calvarial fracture or other acute osseous injury is seen. 2. No acute intracranial abnormality. 3. Nonspecific calcification in the posterior right orbit without aggressive or worrisome features. 4. No acute fracture or traumatic listhesis of the cervical spine. 5. Multilevel degenerative changes of the cervical spine as  described above. 6. Cervical and intracranial atherosclerosis. Electronically Signed   By: Kreg Shropshire M.D.   On: 10/05/2020 00:39   CT Cervical Spine Wo Contrast  Result Date: 10/05/2020 CLINICAL DATA:  Witnessed mechanical fall with positive head strike, no loss of consciousness, posterior scalp laceration EXAM: CT HEAD WITHOUT CONTRAST CT CERVICAL SPINE WITHOUT CONTRAST TECHNIQUE: Multidetector CT imaging of the head and cervical spine was performed following the standard protocol without intravenous contrast. Multiplanar CT image reconstructions of the cervical spine were also generated. COMPARISON:  None. FINDINGS: CT HEAD FINDINGS Brain: No evidence of acute infarction, hemorrhage, hydrocephalus, extra-axial collection, visible mass lesion or mass effect. Symmetric prominence of the ventricles, cisterns and sulci compatible with parenchymal volume loss. Patchy areas of white matter hypoattenuation are most compatible with chronic microvascular angiopathy. Vascular: Atherosclerotic calcification of the carotid siphons and intradural vertebral arteries. No hyperdense vessel. Skull: Right parietal scalp swelling and hematoma with overlying surgical staple repair of an a laceration. Crescentic scalp hematoma measures up to a maximal 6 mm in thickness. No subjacent calvarial fracture or other acute osseous injury is seen. No other significant sites of scalp swelling or hematoma. Sinuses/Orbits: Round calcification in the right orbit appears to be extraconal, without particularly aggressive or worrisome features. Bilateral lens extractions. Orbital structures are otherwise unremarkable. Paranasal sinuses and mastoid air cells are predominantly clear albeit with some asymmetric hypopneumatization of the left mastoid. Other: Bilateral TMJ arthrosis CT CERVICAL SPINE FINDINGS Alignment: Stabilization collar is absent at the time of examination. Mild levoconvex curvature of the cervical spine. No evidence of  traumatic listhesis. No abnormally widened, perched or jumped facets. Normal alignment of the craniocervical and atlantoaxial articulations. Bony fusion of the left C5-6 articular facets. Skull base and vertebrae: No acute skull base fracture. No vertebral body fracture or height loss. Normal bone mineralization. No worrisome osseous lesions. Moderate arthrosis at the atlantodental interval Soft tissues and spinal canal: No pre or paravertebral fluid or swelling. No visible canal hematoma. Airways patent. Cervical carotid and vertebral artery atherosclerosis. Disc levels: Multilevel intervertebral disc height loss with spondylitic endplate changes. Posterior disc osteophyte complexes throughout the cervical spine partially efface the ventral thecal sac without significant central cord impingement. Multilevel uncinate spurring facet hypertrophic changes are present throughout the cervical levels as well resulting in mild-to-moderate multilevel neural foraminal narrowing with more moderate to severe narrowing on the right C3-4 and left C4-5. Upper chest: Extensive biapical pleuroparenchymal scarring calcification. Calcifications in the proximal great vessels as well. Diminutive thyroid without concerning nodules or masses. Other: None. IMPRESSION: 1. Right parietal scalp swelling and hematoma with overlying surgical staple repair of an laceration. Crescentic scalp hematoma measures up to a maximal 6 mm in thickness. No subjacent calvarial fracture or other acute osseous injury is seen. 2. No acute intracranial abnormality. 3. Nonspecific calcification in the posterior right orbit without aggressive or worrisome features. 4.  No acute fracture or traumatic listhesis of the cervical spine. 5. Multilevel degenerative changes of the cervical spine as described above. 6. Cervical and intracranial atherosclerosis. Electronically Signed   By: Kreg Shropshire M.D.   On: 10/05/2020 00:39   CT Hip Right Wo Contrast  Result  Date: 10/05/2020 CLINICAL DATA:  87 year old female with concern for right hip fracture. EXAM: CT OF THE RIGHT HIP WITHOUT CONTRAST TECHNIQUE: Multidetector CT imaging of the right hip was performed according to the standard protocol. Multiplanar CT image reconstructions were also generated. COMPARISON:  Pelvic radiograph dated 10/04/2020. FINDINGS: Bones/Joint/Cartilage There is a nondisplaced fracture of the right femoral neck with mild impaction. The bones are osteopenic. No dislocation. Mild arthritic changes of the right hip. No joint effusion. Ligaments Suboptimally assessed by CT. Muscles and Tendons No acute findings.  No intramuscular fluid collection or hematoma. Soft tissues Diffuse colonic diverticulosis. The remainder of the soft tissues are unremarkable. IMPRESSION: Nondisplaced fracture of the right femoral neck. Electronically Signed   By: Elgie Collard M.D.   On: 10/05/2020 02:38    Pending Labs Unresulted Labs (From admission, onward)          Start     Ordered   10/05/20 0022  Resp Panel by RT-PCR (Flu A&B, Covid) Nasopharyngeal Swab  (Tier 2 - Symptomatic/asymptomatic with Precautions )  Once,   STAT       Question Answer Comment  Is this test for diagnosis or screening Screening   Symptomatic for COVID-19 as defined by CDC No   Hospitalized for COVID-19 No   Admitted to ICU for COVID-19 No   Previously tested for COVID-19 No   Resident in a congregate (group) care setting No   Employed in healthcare setting No   Pregnant No   Has patient completed COVID vaccination(s) (2 doses of Pfizer/Moderna 1 dose of [employer]) Unknown      10/05/20 0021          Vitals/Pain Today's Vitals   10/04/20 2325 10/05/20 0130 10/05/20 0150  BP:  (!) 173/75   Pulse:  68   Resp:  17   SpO2: 99% 98%   PainSc:   0-No pain    Isolation Precautions No active isolations  Medications Medications  acetaminophen (TYLENOL) tablet 650 mg (650 mg Oral Given 10/04/20 2340)     Mobility walks

## 2020-10-05 NOTE — ED Notes (Signed)
Spoke with daughter and advised she has a family friend coming to hospital for her mother to help with medical decisions. She is still 3 hrs away. PT eval for possible discharge from ED per MD orders

## 2020-10-05 NOTE — Progress Notes (Signed)
PT Cancellation Note  Patient Details Name: Kristen Lamb MRN: [id number] DOB: [birth year]   Cancelled Treatment:    Reason Eval/Treat Not Completed: Patient not medically ready. Per chart pt is planning to have surgery here. Will see after surgery  per ortho MD. If plan should change PT can be contacted today at [phone].    [facility] 10/05/2020, 1:19 PM

## 2020-10-05 NOTE — Progress Notes (Signed)
PROGRESS NOTE  Kristen Lamb XHB:[id number] DOB: [birth year]   PCP: No primary care provider on file.  Patient is from: Home.  Independent at baseline. Here from [place] area to take care of her daughter before fall.   DOA: 10/04/2020 LOS: 0  Chief complaints: Fall  Brief Narrative / Interim history: 87 year old F with PMH of RA, prior CVA, paroxysmal A. fib not on AC, LBBB and HTN came to ED after accidental fall and scalp laceration at her daughter's house.  No LOC.  She was found to have nondisplaced right femoral neck fracture.  Initially, patient was to leave AMA to have her surgery done close to home in [place], [place] but changed her mind to stay and have surgery done here.  [facility], Dr. Charlann Boxer consulted. Of note, she is followed by orthopedic surgeon in her hometown for arthritis and there was a plan for possible hip replacement in May 2022.  Subjective: Seen and examined earlier this morning.  Patient was initially undecided about having surgery here mainly because it would be a long drive for her family. However, after talking with her daughter and other family members/friends here, she changed her mind and decided to stay and have surgery done here.  She says she is not in pain.  She denies chest pain or dyspnea with exertion.  She has been pretty functional taking care of her husband who passed away recently.  She is not on blood thinners.  Objective: Vitals:   10/05/20 0715 10/05/20 0739 10/05/20 0914 10/05/20 1141  BP:  (!) 164/72  (!) 155/88  Pulse: 67 71  91  Resp: 15 20  18   Temp:   97.9 F (36.6 C)   TempSrc:   Oral   SpO2: 98% 96%  98%   No intake or output data in the 24 hours ending 10/05/20 1246 There were no vitals filed for this visit.  Examination:  GENERAL: No apparent distress.  Nontoxic. HEENT: MMM.  Vision grossly intact.  Diminished hearing.  Scalp laceration repaired with 3 staples NECK: Supple.  No apparent JVD.  RESP: On RA.  No IWOB.   Fair aeration bilaterally. CVS:  RRR. Heart sounds normal.  ABD/GI/GU: BS+. Abd soft, NTND.  MSK/EXT:  Moves extremities.  Significant deformities in her hands from rheumatoid arthritis SKIN: no apparent skin lesion or wound NEURO: Awake, alert and oriented appropriately.  No apparent focal neuro deficit. PSYCH: Calm. Normal affect.   Procedures:  None yet  Microbiology summarized: COVID-19 and influenza PCR nonreactive.  Assessment & Plan: Accidental fall ground-level fall at home Scalp laceration/hematoma-repaired with 3 staples None displaced right femoral neck fracture-likely due to fall.  -Patient was to leave AMA but changed her mind after talking to her family. -Orthopedic surgery, Dr. 10/07/20 consulted -Stable from cardiopulmonary standpoint.  She has no exertional angina or dyspnea.  Recent TTE in 01/2020 reassuring.  She has A. fib but rate controlled.  She is not on ACE likely due to fall risk.  Paroxysmal A. fib: Rate controlled.  She is on metoprolol at home.  Not on AC. -Continue home metoprolol  Chronic LBBB-no anginal symptoms.  Essential hypertension: On metoprolol at home. -Continue home metoprolol  Rheumatoid arthritis with significant deformities in her hands -Gets Orencia injection outpatient.  There is no height or weight on file to calculate BMI.         DVT prophylaxis:  SCDs Start: 10/05/20 10/07/20  Code Status: Full code Family Communication: Updated patient's daughter over the  phone, and family member at bedside Level of care: Med-Surg Status is: Inpatient  Remains inpatient appropriate because:Ongoing diagnostic testing needed not appropriate for outpatient work up and Inpatient level of care appropriate due to severity of illness   Dispo: The patient is from: Home              Anticipated d/c is to: To be decided              Patient currently is not medically stable to d/c.   Difficult to place patient No       Consultants:   Orthopedic surgery, Dr. Charlann Boxer   Sch Meds:  Scheduled Meds: Continuous Infusions: PRN Meds:.acetaminophen **OR** acetaminophen, ondansetron **OR** ondansetron (ZOFRAN) IV  Antimicrobials: Anti-infectives (From admission, onward)   None       I have personally reviewed the following labs and images: CBC: Recent Labs  Lab 10/05/20 0022  WBC 7.4  NEUTROABS 4.8  HGB 11.6*  HCT 36.1  MCV 97.8  PLT 175   BMP &GFR Recent Labs  Lab 10/05/20 0022  NA 137  K 4.5  CL 100  CO2 26  GLUCOSE 122*  BUN 29*  CREATININE 0.84  CALCIUM 8.4*   CrCl cannot be calculated (Unknown ideal weight.). Liver & Pancreas: No results for input(s): AST, ALT, ALKPHOS, BILITOT, PROT, ALBUMIN in the last 168 hours. No results for input(s): LIPASE, AMYLASE in the last 168 hours. No results for input(s): AMMONIA in the last 168 hours. Diabetic: No results for input(s): HGBA1C in the last 72 hours. No results for input(s): GLUCAP in the last 168 hours. Cardiac Enzymes: No results for input(s): CKTOTAL, CKMB, CKMBINDEX, TROPONINI in the last 168 hours. No results for input(s): PROBNP in the last 8760 hours. Coagulation Profile: Recent Labs  Lab 10/05/20 0022  INR 1.1   Thyroid Function Tests: No results for input(s): TSH, T4TOTAL, FREET4, T3FREE, THYROIDAB in the last 72 hours. Lipid Profile: No results for input(s): CHOL, HDL, LDLCALC, TRIG, CHOLHDL, LDLDIRECT in the last 72 hours. Anemia Panel: No results for input(s): VITAMINB12, FOLATE, FERRITIN, TIBC, IRON, RETICCTPCT in the last 72 hours. Urine analysis: No results found for: COLORURINE, APPEARANCEUR, LABSPEC, PHURINE, GLUCOSEU, HGBUR, BILIRUBINUR, KETONESUR, PROTEINUR, UROBILINOGEN, NITRITE, LEUKOCYTESUR Sepsis Labs: Invalid input(s): PROCALCITONIN, LACTICIDVEN  Microbiology: Recent Results (from the past 240 hour(s))  Resp Panel by RT-PCR (Flu A&B, Covid) Nasopharyngeal Swab     Status: None   Collection Time: 10/05/20  1:45 AM    Specimen: Nasopharyngeal Swab; Nasopharyngeal(NP) swabs in vial transport medium  Result Value Ref Range Status   SARS Coronavirus 2 by RT PCR NEGATIVE NEGATIVE Final    Comment: (NOTE) SARS-CoV-2 target nucleic acids are NOT DETECTED.  The SARS-CoV-2 RNA is generally detectable in upper respiratory specimens during the acute phase of infection. The lowest concentration of SARS-CoV-2 viral copies this assay can detect is 138 copies/mL. A negative result does not preclude SARS-Cov-2 infection and should not be used as the sole basis for treatment or other patient management decisions. A negative result may occur with  improper specimen collection/handling, submission of specimen other than nasopharyngeal swab, presence of viral mutation(s) within the areas targeted by this assay, and inadequate number of viral copies(<138 copies/mL). A negative result must be combined with clinical observations, patient history, and epidemiological information. The expected result is Negative.  Fact Sheet for Patients:  BloggerCourse.com  Fact Sheet for Healthcare Providers:  SeriousBroker.it  This test is no t yet approved or cleared by the  [place] [employer] and  has been authorized for detection and/or diagnosis of SARS-CoV-2 by FDA under an TEFL teacher (EUA). This EUA will remain  in effect (meaning this test can be used) for the duration of the COVID-19 declaration under Section 564(b)(1) of the Act, 21 U.S.C.section 360bbb-3(b)(1), unless the authorization is terminated  or revoked sooner.       Influenza A by PCR NEGATIVE NEGATIVE Final   Influenza B by PCR NEGATIVE NEGATIVE Final    Comment: (NOTE) The Xpert Xpress SARS-CoV-2/FLU/RSV plus assay is intended as an aid in the diagnosis of influenza from Nasopharyngeal swab specimens and should not be used as a sole basis for treatment. Nasal washings and aspirates are  unacceptable for Xpert Xpress SARS-CoV-2/FLU/RSV testing.  Fact Sheet for Patients: BloggerCourse.com  Fact Sheet for Healthcare Providers: SeriousBroker.it  This test is not yet approved or cleared by the [place] FDA and has been authorized for detection and/or diagnosis of SARS-CoV-2 by FDA under an Emergency Use Authorization (EUA). This EUA will remain in effect (meaning this test can be used) for the duration of the COVID-19 declaration under Section 564(b)(1) of the Act, 21 U.S.C. section 360bbb-3(b)(1), unless the authorization is terminated or revoked.  Performed at [facility], [address] [place], [place] [address]     Radiology Studies: DG Chest 1 View  Result Date: 10/04/2020 CLINICAL DATA:  Fall EXAM: CHEST  1 VIEW COMPARISON:  None. FINDINGS: There is hyperinflation of the lungs compatible with COPD. Cardiomegaly. Calcified granulomas in the lungs. No confluent opacity or effusion. No acute bony abnormality. Biapical scarring. IMPRESSION: COPD/chronic changes. Cardiomegaly. No active disease. Electronically Signed   By: Charlett Nose M.D.   On: 10/04/2020 23:51   DG Pelvis 1-2 Views  Result Date: 10/04/2020 CLINICAL DATA:  Fall, right hip pain EXAM: PELVIS - 1-2 VIEW COMPARISON:  None. FINDINGS: Right femoral neck fracture noted. No subluxation or dislocation. Mild degenerative changes in the hips bilaterally. SI joints symmetric and unremarkable. IMPRESSION: Right femoral neck fracture. Electronically Signed   By: Charlett Nose M.D.   On: 10/04/2020 23:52   CT Head Wo Contrast  Result Date: 10/05/2020 CLINICAL DATA:  Witnessed mechanical fall with positive head strike, no loss of consciousness, posterior scalp laceration EXAM: CT HEAD WITHOUT CONTRAST CT CERVICAL SPINE WITHOUT CONTRAST TECHNIQUE: Multidetector CT imaging of the head and cervical spine was performed following the standard  protocol without intravenous contrast. Multiplanar CT image reconstructions of the cervical spine were also generated. COMPARISON:  None. FINDINGS: CT HEAD FINDINGS Brain: No evidence of acute infarction, hemorrhage, hydrocephalus, extra-axial collection, visible mass lesion or mass effect. Symmetric prominence of the ventricles, cisterns and sulci compatible with parenchymal volume loss. Patchy areas of white matter hypoattenuation are most compatible with chronic microvascular angiopathy. Vascular: Atherosclerotic calcification of the carotid siphons and intradural vertebral arteries. No hyperdense vessel. Skull: Right parietal scalp swelling and hematoma with overlying surgical staple repair of an a laceration. Crescentic scalp hematoma measures up to a maximal 6 mm in thickness. No subjacent calvarial fracture or other acute osseous injury is seen. No other significant sites of scalp swelling or hematoma. Sinuses/Orbits: Round calcification in the right orbit appears to be extraconal, without particularly aggressive or worrisome features. Bilateral lens extractions. Orbital structures are otherwise unremarkable. Paranasal sinuses and mastoid air cells are predominantly clear albeit with some asymmetric hypopneumatization of the left mastoid. Other: Bilateral TMJ arthrosis CT CERVICAL SPINE FINDINGS Alignment: Stabilization collar is absent  at the time of examination. Mild levoconvex curvature of the cervical spine. No evidence of traumatic listhesis. No abnormally widened, perched or jumped facets. Normal alignment of the craniocervical and atlantoaxial articulations. Bony fusion of the left C5-6 articular facets. Skull base and vertebrae: No acute skull base fracture. No vertebral body fracture or height loss. Normal bone mineralization. No worrisome osseous lesions. Moderate arthrosis at the atlantodental interval Soft tissues and spinal canal: No pre or paravertebral fluid or swelling. No visible canal  hematoma. Airways patent. Cervical carotid and vertebral artery atherosclerosis. Disc levels: Multilevel intervertebral disc height loss with spondylitic endplate changes. Posterior disc osteophyte complexes throughout the cervical spine partially efface the ventral thecal sac without significant central cord impingement. Multilevel uncinate spurring facet hypertrophic changes are present throughout the cervical levels as well resulting in mild-to-moderate multilevel neural foraminal narrowing with more moderate to severe narrowing on the right C3-4 and left C4-5. Upper chest: Extensive biapical pleuroparenchymal scarring calcification. Calcifications in the proximal great vessels as well. Diminutive thyroid without concerning nodules or masses. Other: None. IMPRESSION: 1. Right parietal scalp swelling and hematoma with overlying surgical staple repair of an laceration. Crescentic scalp hematoma measures up to a maximal 6 mm in thickness. No subjacent calvarial fracture or other acute osseous injury is seen. 2. No acute intracranial abnormality. 3. Nonspecific calcification in the posterior right orbit without aggressive or worrisome features. 4. No acute fracture or traumatic listhesis of the cervical spine. 5. Multilevel degenerative changes of the cervical spine as described above. 6. Cervical and intracranial atherosclerosis. Electronically Signed   By: Kreg Shropshire M.D.   On: 10/05/2020 00:39   CT Cervical Spine Wo Contrast  Result Date: 10/05/2020 CLINICAL DATA:  Witnessed mechanical fall with positive head strike, no loss of consciousness, posterior scalp laceration EXAM: CT HEAD WITHOUT CONTRAST CT CERVICAL SPINE WITHOUT CONTRAST TECHNIQUE: Multidetector CT imaging of the head and cervical spine was performed following the standard protocol without intravenous contrast. Multiplanar CT image reconstructions of the cervical spine were also generated. COMPARISON:  None. FINDINGS: CT HEAD FINDINGS Brain: No  evidence of acute infarction, hemorrhage, hydrocephalus, extra-axial collection, visible mass lesion or mass effect. Symmetric prominence of the ventricles, cisterns and sulci compatible with parenchymal volume loss. Patchy areas of white matter hypoattenuation are most compatible with chronic microvascular angiopathy. Vascular: Atherosclerotic calcification of the carotid siphons and intradural vertebral arteries. No hyperdense vessel. Skull: Right parietal scalp swelling and hematoma with overlying surgical staple repair of an a laceration. Crescentic scalp hematoma measures up to a maximal 6 mm in thickness. No subjacent calvarial fracture or other acute osseous injury is seen. No other significant sites of scalp swelling or hematoma. Sinuses/Orbits: Round calcification in the right orbit appears to be extraconal, without particularly aggressive or worrisome features. Bilateral lens extractions. Orbital structures are otherwise unremarkable. Paranasal sinuses and mastoid air cells are predominantly clear albeit with some asymmetric hypopneumatization of the left mastoid. Other: Bilateral TMJ arthrosis CT CERVICAL SPINE FINDINGS Alignment: Stabilization collar is absent at the time of examination. Mild levoconvex curvature of the cervical spine. No evidence of traumatic listhesis. No abnormally widened, perched or jumped facets. Normal alignment of the craniocervical and atlantoaxial articulations. Bony fusion of the left C5-6 articular facets. Skull base and vertebrae: No acute skull base fracture. No vertebral body fracture or height loss. Normal bone mineralization. No worrisome osseous lesions. Moderate arthrosis at the atlantodental interval Soft tissues and spinal canal: No pre or paravertebral fluid or swelling. No visible canal  hematoma. Airways patent. Cervical carotid and vertebral artery atherosclerosis. Disc levels: Multilevel intervertebral disc height loss with spondylitic endplate changes. Posterior  disc osteophyte complexes throughout the cervical spine partially efface the ventral thecal sac without significant central cord impingement. Multilevel uncinate spurring facet hypertrophic changes are present throughout the cervical levels as well resulting in mild-to-moderate multilevel neural foraminal narrowing with more moderate to severe narrowing on the right C3-4 and left C4-5. Upper chest: Extensive biapical pleuroparenchymal scarring calcification. Calcifications in the proximal great vessels as well. Diminutive thyroid without concerning nodules or masses. Other: None. IMPRESSION: 1. Right parietal scalp swelling and hematoma with overlying surgical staple repair of an laceration. Crescentic scalp hematoma measures up to a maximal 6 mm in thickness. No subjacent calvarial fracture or other acute osseous injury is seen. 2. No acute intracranial abnormality. 3. Nonspecific calcification in the posterior right orbit without aggressive or worrisome features. 4. No acute fracture or traumatic listhesis of the cervical spine. 5. Multilevel degenerative changes of the cervical spine as described above. 6. Cervical and intracranial atherosclerosis. Electronically Signed   By: Kreg Shropshire M.D.   On: 10/05/2020 00:39   CT Hip Right Wo Contrast  Result Date: 10/05/2020 CLINICAL DATA:  87 year old female with concern for right hip fracture. EXAM: CT OF THE RIGHT HIP WITHOUT CONTRAST TECHNIQUE: Multidetector CT imaging of the right hip was performed according to the standard protocol. Multiplanar CT image reconstructions were also generated. COMPARISON:  Pelvic radiograph dated 10/04/2020. FINDINGS: Bones/Joint/Cartilage There is a nondisplaced fracture of the right femoral neck with mild impaction. The bones are osteopenic. No dislocation. Mild arthritic changes of the right hip. No joint effusion. Ligaments Suboptimally assessed by CT. Muscles and Tendons No acute findings.  No intramuscular fluid collection or  hematoma. Soft tissues Diffuse colonic diverticulosis. The remainder of the soft tissues are unremarkable. IMPRESSION: Nondisplaced fracture of the right femoral neck. Electronically Signed   By: Elgie Collard M.D.   On: 10/05/2020 02:38      [facility]  If 7PM-7AM, please contact night-coverage [email or web address] 10/05/2020, 12:46 PM

## 2020-10-05 NOTE — Plan of Care (Signed)
  Problem: Education: Goal: Knowledge of General Education information will improve Description: Including pain rating scale, medication(s)/side effects and non-pharmacologic comfort measures Outcome: Progressing   Problem: Nutrition: Goal: Adequate nutrition will be maintained Outcome: Progressing   

## 2020-10-05 NOTE — ED Provider Notes (Addendum)
[facility] Note   CSN: [id number] Arrival date & time: 10/04/20  2310     History Chief Complaint  Patient presents with  . Fall    Kristen Lamb is a 87 y.o. female history CHF, A. fib not on anticoagulation, here presenting with fall.  Patient had a mechanical fall and fell backwards while trying to remove her shoe.  Patient was noted to have a laceration in the back of her head.  She also was complaining of some right pelvic pain.  Patient states that her tetanus is up-to-date.  The history is provided by the patient.       Past Medical History:  Diagnosis Date  . A-fib (HCC)   . CHF (congestive heart failure) (HCC)   . Hypertension   . Stroke [facility])     There are no problems to display for this patient.      OB History   No obstetric history on file.     No family history on file.     Home Medications Prior to Admission medications   Not on File    Allergies    Morphine and related and Shellfish allergy  Review of Systems   Review of Systems  Skin: Positive for wound.  All other systems reviewed and are negative.   Physical Exam Updated Vital Signs BP (!) 173/75   Pulse 68   Resp 17   SpO2 98%   Physical Exam Vitals and nursing note reviewed.  Constitutional:      Comments: Slightly demented  HENT:     Head: Normocephalic.     Comments: 3 cm laceration in the right parietal scalp    Nose: Nose normal.     Mouth/Throat:     Mouth: Mucous membranes are moist.  Eyes:     Extraocular Movements: Extraocular movements intact.     Pupils: Pupils are equal, round, and reactive to light.  Cardiovascular:     Rate and Rhythm: Normal rate and regular rhythm.     Pulses: Normal pulses.     Heart sounds: Normal heart sounds.  Pulmonary:     Effort: Pulmonary effort is normal.     Breath sounds: Normal breath sounds.  Abdominal:     General: Abdomen is flat.     Palpations: Abdomen is soft.   Musculoskeletal:     Cervical back: Normal range of motion.     Comments: Mild tenderness over the right hip but she is able to range the hip and is not sure during or rotated.  No spinal tenderness.  No extremity trauma.  Skin:    General: Skin is warm.     Capillary Refill: Capillary refill takes less than 2 seconds.  Neurological:     General: No focal deficit present.     Mental Status: She is oriented to person, place, and time.  Psychiatric:        Mood and Affect: Mood normal.        Behavior: Behavior normal.     ED Results / Procedures / Treatments   Labs (all labs ordered are listed, but only abnormal results are displayed) Labs Reviewed  CBC WITH DIFFERENTIAL/PLATELET - Abnormal; Notable for the following components:      Result Value   RBC 3.69 (*)    Hemoglobin 11.6 (*)    All other components within normal limits  BASIC METABOLIC PANEL - Abnormal; Notable for the following components:   Glucose, Bld 122 (*)  BUN 29 (*)    Calcium 8.4 (*)    All other components within normal limits  RESP PANEL BY RT-PCR (FLU A&B, COVID) ARPGX2  PROTIME-INR    EKG None  Radiology DG Chest 1 View  Result Date: 10/04/2020 CLINICAL DATA:  Fall EXAM: CHEST  1 VIEW COMPARISON:  None. FINDINGS: There is hyperinflation of the lungs compatible with COPD. Cardiomegaly. Calcified granulomas in the lungs. No confluent opacity or effusion. No acute bony abnormality. Biapical scarring. IMPRESSION: COPD/chronic changes. Cardiomegaly. No active disease. Electronically Signed   By: Charlett Nose M.D.   On: 10/04/2020 23:51   DG Pelvis 1-2 Views  Result Date: 10/04/2020 CLINICAL DATA:  Fall, right hip pain EXAM: PELVIS - 1-2 VIEW COMPARISON:  None. FINDINGS: Right femoral neck fracture noted. No subluxation or dislocation. Mild degenerative changes in the hips bilaterally. SI joints symmetric and unremarkable. IMPRESSION: Right femoral neck fracture. Electronically Signed   By: Charlett Nose  M.D.   On: 10/04/2020 23:52   CT Head Wo Contrast  Result Date: 10/05/2020 CLINICAL DATA:  Witnessed mechanical fall with positive head strike, no loss of consciousness, posterior scalp laceration EXAM: CT HEAD WITHOUT CONTRAST CT CERVICAL SPINE WITHOUT CONTRAST TECHNIQUE: Multidetector CT imaging of the head and cervical spine was performed following the standard protocol without intravenous contrast. Multiplanar CT image reconstructions of the cervical spine were also generated. COMPARISON:  None. FINDINGS: CT HEAD FINDINGS Brain: No evidence of acute infarction, hemorrhage, hydrocephalus, extra-axial collection, visible mass lesion or mass effect. Symmetric prominence of the ventricles, cisterns and sulci compatible with parenchymal volume loss. Patchy areas of white matter hypoattenuation are most compatible with chronic microvascular angiopathy. Vascular: Atherosclerotic calcification of the carotid siphons and intradural vertebral arteries. No hyperdense vessel. Skull: Right parietal scalp swelling and hematoma with overlying surgical staple repair of an a laceration. Crescentic scalp hematoma measures up to a maximal 6 mm in thickness. No subjacent calvarial fracture or other acute osseous injury is seen. No other significant sites of scalp swelling or hematoma. Sinuses/Orbits: Round calcification in the right orbit appears to be extraconal, without particularly aggressive or worrisome features. Bilateral lens extractions. Orbital structures are otherwise unremarkable. Paranasal sinuses and mastoid air cells are predominantly clear albeit with some asymmetric hypopneumatization of the left mastoid. Other: Bilateral TMJ arthrosis CT CERVICAL SPINE FINDINGS Alignment: Stabilization collar is absent at the time of examination. Mild levoconvex curvature of the cervical spine. No evidence of traumatic listhesis. No abnormally widened, perched or jumped facets. Normal alignment of the craniocervical and  atlantoaxial articulations. Bony fusion of the left C5-6 articular facets. Skull base and vertebrae: No acute skull base fracture. No vertebral body fracture or height loss. Normal bone mineralization. No worrisome osseous lesions. Moderate arthrosis at the atlantodental interval Soft tissues and spinal canal: No pre or paravertebral fluid or swelling. No visible canal hematoma. Airways patent. Cervical carotid and vertebral artery atherosclerosis. Disc levels: Multilevel intervertebral disc height loss with spondylitic endplate changes. Posterior disc osteophyte complexes throughout the cervical spine partially efface the ventral thecal sac without significant central cord impingement. Multilevel uncinate spurring facet hypertrophic changes are present throughout the cervical levels as well resulting in mild-to-moderate multilevel neural foraminal narrowing with more moderate to severe narrowing on the right C3-4 and left C4-5. Upper chest: Extensive biapical pleuroparenchymal scarring calcification. Calcifications in the proximal great vessels as well. Diminutive thyroid without concerning nodules or masses. Other: None. IMPRESSION: 1. Right parietal scalp swelling and hematoma with overlying surgical staple repair  of an laceration. Crescentic scalp hematoma measures up to a maximal 6 mm in thickness. No subjacent calvarial fracture or other acute osseous injury is seen. 2. No acute intracranial abnormality. 3. Nonspecific calcification in the posterior right orbit without aggressive or worrisome features. 4. No acute fracture or traumatic listhesis of the cervical spine. 5. Multilevel degenerative changes of the cervical spine as described above. 6. Cervical and intracranial atherosclerosis. Electronically Signed   By: Kreg Shropshire M.D.   On: 10/05/2020 00:39   CT Cervical Spine Wo Contrast  Result Date: 10/05/2020 CLINICAL DATA:  Witnessed mechanical fall with positive head strike, no loss of consciousness,  posterior scalp laceration EXAM: CT HEAD WITHOUT CONTRAST CT CERVICAL SPINE WITHOUT CONTRAST TECHNIQUE: Multidetector CT imaging of the head and cervical spine was performed following the standard protocol without intravenous contrast. Multiplanar CT image reconstructions of the cervical spine were also generated. COMPARISON:  None. FINDINGS: CT HEAD FINDINGS Brain: No evidence of acute infarction, hemorrhage, hydrocephalus, extra-axial collection, visible mass lesion or mass effect. Symmetric prominence of the ventricles, cisterns and sulci compatible with parenchymal volume loss. Patchy areas of white matter hypoattenuation are most compatible with chronic microvascular angiopathy. Vascular: Atherosclerotic calcification of the carotid siphons and intradural vertebral arteries. No hyperdense vessel. Skull: Right parietal scalp swelling and hematoma with overlying surgical staple repair of an a laceration. Crescentic scalp hematoma measures up to a maximal 6 mm in thickness. No subjacent calvarial fracture or other acute osseous injury is seen. No other significant sites of scalp swelling or hematoma. Sinuses/Orbits: Round calcification in the right orbit appears to be extraconal, without particularly aggressive or worrisome features. Bilateral lens extractions. Orbital structures are otherwise unremarkable. Paranasal sinuses and mastoid air cells are predominantly clear albeit with some asymmetric hypopneumatization of the left mastoid. Other: Bilateral TMJ arthrosis CT CERVICAL SPINE FINDINGS Alignment: Stabilization collar is absent at the time of examination. Mild levoconvex curvature of the cervical spine. No evidence of traumatic listhesis. No abnormally widened, perched or jumped facets. Normal alignment of the craniocervical and atlantoaxial articulations. Bony fusion of the left C5-6 articular facets. Skull base and vertebrae: No acute skull base fracture. No vertebral body fracture or height loss. Normal  bone mineralization. No worrisome osseous lesions. Moderate arthrosis at the atlantodental interval Soft tissues and spinal canal: No pre or paravertebral fluid or swelling. No visible canal hematoma. Airways patent. Cervical carotid and vertebral artery atherosclerosis. Disc levels: Multilevel intervertebral disc height loss with spondylitic endplate changes. Posterior disc osteophyte complexes throughout the cervical spine partially efface the ventral thecal sac without significant central cord impingement. Multilevel uncinate spurring facet hypertrophic changes are present throughout the cervical levels as well resulting in mild-to-moderate multilevel neural foraminal narrowing with more moderate to severe narrowing on the right C3-4 and left C4-5. Upper chest: Extensive biapical pleuroparenchymal scarring calcification. Calcifications in the proximal great vessels as well. Diminutive thyroid without concerning nodules or masses. Other: None. IMPRESSION: 1. Right parietal scalp swelling and hematoma with overlying surgical staple repair of an laceration. Crescentic scalp hematoma measures up to a maximal 6 mm in thickness. No subjacent calvarial fracture or other acute osseous injury is seen. 2. No acute intracranial abnormality. 3. Nonspecific calcification in the posterior right orbit without aggressive or worrisome features. 4. No acute fracture or traumatic listhesis of the cervical spine. 5. Multilevel degenerative changes of the cervical spine as described above. 6. Cervical and intracranial atherosclerosis. Electronically Signed   By: Kreg Shropshire M.D.   On: 10/05/2020  00:39   CT Hip Right Wo Contrast  Result Date: 10/05/2020 CLINICAL DATA:  87 year old female with concern for right hip fracture. EXAM: CT OF THE RIGHT HIP WITHOUT CONTRAST TECHNIQUE: Multidetector CT imaging of the right hip was performed according to the standard protocol. Multiplanar CT image reconstructions were also generated.  COMPARISON:  Pelvic radiograph dated 10/04/2020. FINDINGS: Bones/Joint/Cartilage There is a nondisplaced fracture of the right femoral neck with mild impaction. The bones are osteopenic. No dislocation. Mild arthritic changes of the right hip. No joint effusion. Ligaments Suboptimally assessed by CT. Muscles and Tendons No acute findings.  No intramuscular fluid collection or hematoma. Soft tissues Diffuse colonic diverticulosis. The remainder of the soft tissues are unremarkable. IMPRESSION: Nondisplaced fracture of the right femoral neck. Electronically Signed   By: Elgie Collard M.D.   On: 10/05/2020 02:38    Procedures Procedures  LACERATION REPAIR Performed by: Richardean Canal Authorized by: Richardean Canal Consent: Verbal consent obtained. Risks and benefits: risks, benefits and alternatives were discussed Consent given by: patient Patient identity confirmed: provided demographic data Prepped and Draped in normal sterile fashion Wound explored  Laceration Location: R scalp   Laceration Length: 3 cm  No Foreign Bodies seen or palpated  Anesthesia: none   Irrigation method: syringe Amount of cleaning: standard  Skin closure: staples   Number of staples: 3  Technique: see above   Patient tolerance: Patient tolerated the procedure well with no immediate complications.    Medications Ordered in ED Medications  acetaminophen (TYLENOL) tablet 650 mg (650 mg Oral Given 10/04/20 2340)    ED Course  I have reviewed the triage vital signs and the nursing notes.  Pertinent labs & imaging results that were available during my care of the patient were reviewed by me and considered in my medical decision making (see chart for details).    MDM Rules/Calculators/A&P                         Kristen Lamb is a 87 y.o. female presenting with fall and right scalp laceration and hip pain.  Patient has chronic hip pain and had a mechanical fall and has a right scalp laceration.  Plan to  get CT head and cervical spine.  We will also stapled the laceration.  We will also get chest x-ray and pelvis x-ray  12:30 AM X-ray showed possible hip fracture.  Consult with Dr. Ophelia Charter who recommend n.p.o. and he will perform surgery in the morning.  We will get preop labs for  3:02 AM Preop labs unremarkable and Covid test is pending.  Hospitalist to admit for hip surgery in the morning.  4:46 AM Patient told the hospitalist that she does not want to get her surgery here.  I went back and talked to the patient.  She lives in [place] and is here to take care of her family.  She wants her surgery done there.  She talked to her daughter, Margaretha Glassing.  She also talked to the patient.  Patient is very adamant that she wants to go back to [place] for the surgery.  The daughter will come to pick her up.  She is requesting transfer to [place] but I told her that this is too far for transfer and we have services here so is not an appropriate transfer.  Patient understands the risks of leaving AGAINST MEDICAL ADVICE including worsening pain and disability.  Daughter states that she will come in the morning  to pick her up and patient states that she understands the risks and will leave with her daughter.   Final Clinical Impression(s) / ED Diagnoses Final diagnoses:  None    Rx / DC Orders ED Discharge Orders    None       Charlynne Pander, MD 10/05/20 Donnal Debar    Charlynne Pander, MD 10/05/20 [phone]

## 2020-10-05 NOTE — Progress Notes (Signed)
Patient ID: Kristen Lamb, female   DOB: 1930-01-22, 87 y.o.   MRN: [id number] Consult received I will see her in the morning and discuss treatment options from screw fixation versus THR based on history reported  NPO after MN

## 2020-10-05 NOTE — Plan of Care (Signed)
  Problem: Education: Goal: Knowledge of General Education information will improve Description: Including pain rating scale, medication(s)/side effects and non-pharmacologic comfort measures Outcome: Progressing   Problem: Activity: Goal: Risk for activity intolerance will decrease Outcome: Progressing   Problem: Pain Managment: Goal: General experience of comfort will improve Outcome: Progressing   

## 2020-10-05 NOTE — Discharge Instructions (Addendum)
INSTRUCTIONS AFTER JOINT REPLACEMENT   - The dressing on your hip is water proof. You may shower without covering it, but do not bathe/swim. Remove after 12 days. - You may bear full weight on your hip. You may sleep in any position that is comfortable. - Contact our office at [phone] to schedule a follow up appointment when you plan to be in town visiting your daughter, preferably in the next 4-12 weeks.   o Remove items at home which could result in a fall. This includes throw rugs or furniture in walking pathways o ICE to the affected joint every three hours while awake for 30 minutes at a time, for at least the first 3-5 days, and then as needed for pain and swelling.  Continue to use ice for pain and swelling. You may notice swelling that will progress down to the foot and ankle.  This is normal after surgery.  Elevate your leg when you are not up walking on it.   o Continue to use the breathing machine you got in the hospital (incentive spirometer) which will help keep your temperature down.  It is common for your temperature to cycle up and down following surgery, especially at night when you are not up moving around and exerting yourself.  The breathing machine keeps your lungs expanded and your temperature down.   DIET:  As you were doing prior to hospitalization, we recommend a well-balanced diet.  DRESSING / WOUND CARE / SHOWERING  Keep the surgical dressing until follow up.  The dressing is water proof, so you can shower without any extra covering.  IF THE DRESSING FALLS OFF or the wound gets wet inside, change the dressing with sterile gauze.  Please use good hand washing techniques before changing the dressing.  Do not use any lotions or creams on the incision until instructed by your surgeon.     ACTIVITY  o Increase activity slowly as tolerated, but follow the weight bearing instructions below.   o No driving for 6 weeks or until further direction given by your physician.   You cannot drive while taking narcotics.  o No lifting or carrying greater than 10 lbs. until further directed by your surgeon. o Avoid periods of inactivity such as sitting longer than an hour when not asleep. This helps prevent blood clots.  o You may return to work once you are authorized by your doctor.     WEIGHT BEARING   Weight bearing as tolerated with assist device (walker, cane, etc) as directed, use it as long as suggested by your surgeon or therapist, typically at least 4-6 weeks.   EXERCISES  Results after joint replacement surgery are often greatly improved when you follow the exercise, range of motion and muscle strengthening exercises prescribed by your doctor. Safety measures are also important to protect the joint from further injury. Any time any of these exercises cause you to have increased pain or swelling, decrease what you are doing until you are comfortable again and then slowly increase them. If you have problems or questions, call your caregiver or physical therapist for advice.   Rehabilitation is important following a joint replacement. After just a few days of immobilization, the muscles of the leg can become weakened and shrink (atrophy).  These exercises are designed to build up the tone and strength of the thigh and leg muscles and to improve motion. Often times heat used for twenty to thirty minutes before working out will loosen up your tissues  and help with improving the range of motion but do not use heat for the first two weeks following surgery (sometimes heat can increase post-operative swelling).   These exercises can be done on a training (exercise) mat, on the floor, on a table or on a bed. Use whatever works the best and is most comfortable for you.    Use music or television while you are exercising so that the exercises are a pleasant break in your day. This will make your life better with the exercises acting as a break in your routine that you can  look forward to.   Perform all exercises about fifteen times, three times per day or as directed.  You should exercise both the operative leg and the other leg as well.  Exercises include:   . Quad Sets - Tighten up the muscle on the front of the thigh (Quad) and hold for 5-10 seconds.   . Straight Leg Raises - With your knee straight (if you were given a brace, keep it on), lift the leg to 60 degrees, hold for 3 seconds, and slowly lower the leg.  Perform this exercise against resistance later as your leg gets stronger.  . Leg Slides: Lying on your back, slowly slide your foot toward your buttocks, bending your knee up off the floor (only go as far as is comfortable). Then slowly slide your foot back down until your leg is flat on the floor again.  Lawanna Kobus Wings: Lying on your back spread your legs to the side as far apart as you can without causing discomfort.  . Hamstring Strength:  Lying on your back, push your heel against the floor with your leg straight by tightening up the muscles of your buttocks.  Repeat, but this time bend your knee to a comfortable angle, and push your heel against the floor.  You may put a pillow under the heel to make it more comfortable if necessary.   A rehabilitation program following joint replacement surgery can speed recovery and prevent re-injury in the future due to weakened muscles. Contact your doctor or a physical therapist for more information on knee rehabilitation.    CONSTIPATION  Constipation is defined medically as fewer than three stools per week and severe constipation as less than one stool per week.  Even if you have a regular bowel pattern at home, your normal regimen is likely to be disrupted due to multiple reasons following surgery.  Combination of anesthesia, postoperative narcotics, change in appetite and fluid intake all can affect your bowels.   YOU MUST use at least one of the following options; they are listed in order of increasing  strength to get the job done.  They are all available over the counter, and you may need to use some, POSSIBLY even all of these options:    Drink plenty of fluids (prune juice may be helpful) and high fiber foods Colace 100 mg by mouth twice a day  Senokot for constipation as directed and as needed Dulcolax (bisacodyl), take with full glass of water  Miralax (polyethylene glycol) once or twice a day as needed.  If you have tried all these things and are unable to have a bowel movement in the first 3-4 days after surgery call either your surgeon or your primary doctor.    If you experience loose stools or diarrhea, hold the medications until you stool forms back up.  If your symptoms do not get better within 1 week or if  they get worse, check with your doctor.  If you experience "the worst abdominal pain ever" or develop nausea or vomiting, please contact the office immediately for further recommendations for treatment.   ITCHING:  If you experience itching with your medications, try taking only a single pain pill, or even half a pain pill at a time.  You can also use Benadryl over the counter for itching or also to help with sleep.   TED HOSE STOCKINGS:  Use stockings on both legs until for at least 2 weeks or as directed by physician office. They may be removed at night for sleeping.  MEDICATIONS:  See your medication summary on the "After Visit Summary" that nursing will review with you.  You may have some home medications which will be placed on hold until you complete the course of blood thinner medication.  It is important for you to complete the blood thinner medication as prescribed.  PRECAUTIONS:  If you experience chest pain or shortness of breath - call 911 immediately for transfer to the hospital emergency department.   If you develop a fever greater that 101 F, purulent drainage from wound, increased redness or drainage from wound, foul odor from the wound/dressing, or calf pain -  CONTACT YOUR SURGEON.                                                   FOLLOW-UP APPOINTMENTS:  If you do not already have a post-op appointment, please call the office for an appointment to be seen by your surgeon.  Guidelines for how soon to be seen are listed in your "After Visit Summary", but are typically between 1-4 weeks after surgery.  OTHER INSTRUCTIONS:   Knee Replacement:  Do not place pillow under knee, focus on keeping the knee straight while resting. CPM instructions: 0-90 degrees, 2 hours in the morning, 2 hours in the afternoon, and 2 hours in the evening. Place foam block, curve side up under heel at all times except when in CPM or when walking.  DO NOT modify, tear, cut, or change the foam block in any way.  POST-OPERATIVE OPIOID TAPER INSTRUCTIONS: . It is important to wean off of your opioid medication as soon as possible. If you do not need pain medication after your surgery it is ok to stop day one. Marland Kitchen Opioids include: o Codeine, Hydrocodone(Norco, Vicodin), Oxycodone(Percocet, oxycontin) and hydromorphone amongst others.  . Long term and even short term use of opiods can cause: o Increased pain response o Dependence o Constipation o Depression o Respiratory depression o And more.  . Withdrawal symptoms can include o Flu like symptoms o Nausea, vomiting o And more . Techniques to manage these symptoms o Hydrate well o Eat regular healthy meals o Stay active o Use relaxation techniques(deep breathing, meditating, yoga) . Do Not substitute Alcohol to help with tapering . If you have been on opioids for less than two weeks and do not have pain than it is ok to stop all together.  . Plan to wean off of opioids o This plan should start within one week post op of your joint replacement. o Maintain the same interval or time between taking each dose and first decrease the dose.  o Cut the total daily intake of opioids by one tablet each day o Next start to increase the  time between doses. o The last dose that should be eliminated is the evening dose.     MAKE SURE YOU:  . Understand these instructions.  . Get help right away if you are not doing well or get worse.    Thank you for letting us be a part of your medical care team.  It is a privilege we respect greatly.  We hope these instructions will help you stay on track for a fast and full recovery!

## 2020-10-06 ENCOUNTER — Encounter (HOSPITAL_COMMUNITY)
Admission: EM | Disposition: A | Discharge status: Home Health | Payer: Self-pay | Source: Home / Self Care | Attending: Student

## 2020-10-06 ENCOUNTER — Inpatient Hospital Stay (HOSPITAL_COMMUNITY): Payer: Medicare PPO

## 2020-10-06 ENCOUNTER — Inpatient Hospital Stay (HOSPITAL_COMMUNITY): Payer: Medicare PPO | Admitting: Certified Registered Nurse Anesthetist

## 2020-10-06 ENCOUNTER — Encounter (HOSPITAL_COMMUNITY): Payer: Self-pay | Admitting: Internal Medicine

## 2020-10-06 DIAGNOSIS — Z96641 Presence of right artificial hip joint: Secondary | ICD-10-CM | POA: Diagnosis not present

## 2020-10-06 DIAGNOSIS — R636 Underweight: Secondary | ICD-10-CM

## 2020-10-06 HISTORY — PX: TOTAL HIP ARTHROPLASTY: SHX124

## 2020-10-06 LAB — CBC
HCT: 36.6 % (ref 36.0–46.0)
Hemoglobin: 11.5 g/dL — ABNORMAL LOW (ref 12.0–15.0)
MCH: 30.7 pg (ref 26.0–34.0)
MCHC: 31.4 g/dL (ref 30.0–36.0)
MCV: 97.6 fL (ref 80.0–100.0)
Platelets: 176 10*3/uL (ref 150–400)
RBC: 3.75 MIL/uL — ABNORMAL LOW (ref 3.87–5.11)
RDW: 14.9 % (ref 11.5–15.5)
WBC: 5.9 10*3/uL (ref 4.0–10.5)
nRBC: 0 % (ref 0.0–0.2)

## 2020-10-06 LAB — BASIC METABOLIC PANEL
Anion gap: 7 (ref 5–15)
BUN: 16 mg/dL (ref 8–23)
CO2: 28 mmol/L (ref 22–32)
Calcium: 8.4 mg/dL — ABNORMAL LOW (ref 8.9–10.3)
Chloride: 104 mmol/L (ref 98–111)
Creatinine, Ser: 0.62 mg/dL (ref 0.44–1.00)
GFR, Estimated: >60 mL/min (ref >60)
Glucose, Bld: 97 mg/dL (ref 70–99)
Potassium: 4.1 mmol/L (ref 3.5–5.1)
Sodium: 139 mmol/L (ref 135–145)

## 2020-10-06 LAB — MAGNESIUM: Magnesium: 2.2 mg/dL (ref 1.7–2.4)

## 2020-10-06 LAB — PHOSPHORUS: Phosphorus: 3.1 mg/dL (ref 2.5–4.6)

## 2020-10-06 LAB — VITAMIN D 25 HYDROXY (VIT D DEFICIENCY, FRACTURES): Vit D, 25-Hydroxy: 33.52 ng/mL (ref 30–100)

## 2020-10-06 LAB — TSH: TSH: 2.469 u[IU]/mL (ref 0.350–4.500)

## 2020-10-06 SURGERY — ARTHROPLASTY, HIP, TOTAL, ANTERIOR APPROACH
Anesthesia: General | Site: Hip | Laterality: Right

## 2020-10-06 MED ORDER — PROPOFOL 10 MG/ML IV BOLUS
INTRAVENOUS | Status: AC
  Filled 2020-10-06: qty 20

## 2020-10-06 MED ORDER — LIDOCAINE 2% (20 MG/ML) 5 ML SYRINGE
INTRAMUSCULAR | Status: AC
  Filled 2020-10-06: qty 5

## 2020-10-06 MED ORDER — BISACODYL 10 MG RE SUPP: 10.0000 mg | Freq: Every day | RECTAL | Status: DC | PRN

## 2020-10-06 MED ORDER — FENTANYL CITRATE (PF) 100 MCG/2ML IJ SOLN
INTRAMUSCULAR | Status: AC
  Filled 2020-10-06: qty 2

## 2020-10-06 MED ORDER — METOCLOPRAMIDE HCL 5 MG/ML IJ SOLN
5.0000 mg | Freq: Three times a day (TID) | INTRAMUSCULAR | Status: DC | PRN
Start: 2020-10-06 — End: 2020-10-08

## 2020-10-06 MED ORDER — MENTHOL 3 MG MT LOZG
1.0000 | LOZENGE | OROMUCOSAL | Status: DC | PRN
Start: 2020-10-06 — End: 2020-10-08

## 2020-10-06 MED ORDER — METOPROLOL TARTRATE 50 MG PO TABS
50.0000 mg | ORAL_TABLET | Freq: Two times a day (BID) | ORAL | Status: DC
  Administered 2020-10-06 – 2020-10-07 (×2): 50 mg via ORAL
  Filled 2020-10-06 (×2): qty 1

## 2020-10-06 MED ORDER — POLYETHYLENE GLYCOL 3350 17 G PO PACK: 17.0000 g | PACK | Freq: Every day | ORAL | Status: DC | PRN

## 2020-10-06 MED ORDER — PHENOL 1.4 % MT LIQD: 1.0000 | OROMUCOSAL | Status: DC | PRN

## 2020-10-06 MED ORDER — DEXAMETHASONE SODIUM PHOSPHATE 10 MG/ML IJ SOLN
10.0000 mg | Freq: Once | INTRAMUSCULAR | Status: AC
  Administered 2020-10-07: 10 mg via INTRAVENOUS
  Filled 2020-10-06: qty 1

## 2020-10-06 MED ORDER — ONDANSETRON HCL 4 MG PO TABS
4.0000 mg | ORAL_TABLET | Freq: Four times a day (QID) | ORAL | Status: DC | PRN
  Filled 2020-10-06: qty 1

## 2020-10-06 MED ORDER — POVIDONE-IODINE 10 % EX SWAB: 2.0000 "application " | Freq: Once | CUTANEOUS | Status: DC

## 2020-10-06 MED ORDER — LACTATED RINGERS IV SOLN: INTRAVENOUS | Status: DC

## 2020-10-06 MED ORDER — FERROUS SULFATE 325 (65 FE) MG PO TABS
325.0000 mg | ORAL_TABLET | Freq: Three times a day (TID) | ORAL | Status: DC
  Administered 2020-10-07 – 2020-10-08 (×3): 325 mg via ORAL
  Filled 2020-10-06 (×3): qty 1

## 2020-10-06 MED ORDER — STERILE WATER FOR IRRIGATION IR SOLN
Status: DC | PRN
  Administered 2020-10-06: 1000 mL

## 2020-10-06 MED ORDER — TRANEXAMIC ACID-NACL 1000-0.7 MG/100ML-% IV SOLN
1000.0000 mg | INTRAVENOUS | Status: AC
  Administered 2020-10-06: 1000 mg via INTRAVENOUS
  Filled 2020-10-06: qty 100

## 2020-10-06 MED ORDER — 0.9 % SODIUM CHLORIDE (POUR BTL) OPTIME
TOPICAL | Status: DC | PRN
  Administered 2020-10-06: 1000 mL

## 2020-10-06 MED ORDER — ASPIRIN 81 MG PO CHEW
81.0000 mg | CHEWABLE_TABLET | Freq: Two times a day (BID) | ORAL | Status: DC
  Administered 2020-10-06 – 2020-10-08 (×4): 81 mg via ORAL
  Filled 2020-10-06 (×4): qty 1

## 2020-10-06 MED ORDER — CEFAZOLIN SODIUM-DEXTROSE 2-4 GM/100ML-% IV SOLN
2.0000 g | Freq: Once | INTRAVENOUS | Status: AC
  Administered 2020-10-07: 2 g via INTRAVENOUS
  Filled 2020-10-06: qty 100

## 2020-10-06 MED ORDER — PROPOFOL 10 MG/ML IV BOLUS
INTRAVENOUS | Status: DC | PRN
  Administered 2020-10-06: 200 mg via INTRAVENOUS

## 2020-10-06 MED ORDER — CHLORHEXIDINE GLUCONATE 0.12 % MT SOLN
15.0000 mL | Freq: Once | OROMUCOSAL | Status: AC
  Administered 2020-10-06: 15 mL via OROMUCOSAL

## 2020-10-06 MED ORDER — HYDROCODONE-ACETAMINOPHEN 5-325 MG PO TABS
1.0000 | ORAL_TABLET | Freq: Four times a day (QID) | ORAL | Status: DC | PRN
  Administered 2020-10-06: 1 via ORAL
  Filled 2020-10-06 (×2): qty 1

## 2020-10-06 MED ORDER — PHENYLEPHRINE HCL (PRESSORS) 10 MG/ML IV SOLN
INTRAVENOUS | Status: AC
  Filled 2020-10-06: qty 1

## 2020-10-06 MED ORDER — ONDANSETRON HCL 4 MG/2ML IJ SOLN: 4.0000 mg | Freq: Once | INTRAMUSCULAR | Status: DC | PRN

## 2020-10-06 MED ORDER — ACETAMINOPHEN 10 MG/ML IV SOLN
500.0000 mg | Freq: Once | INTRAVENOUS | Status: DC | PRN
  Administered 2020-10-06: 500 mg via INTRAVENOUS

## 2020-10-06 MED ORDER — TRANEXAMIC ACID-NACL 1000-0.7 MG/100ML-% IV SOLN
1000.0000 mg | Freq: Once | INTRAVENOUS | Status: AC
  Administered 2020-10-06: 1000 mg via INTRAVENOUS
  Filled 2020-10-06: qty 100

## 2020-10-06 MED ORDER — ACETAMINOPHEN 10 MG/ML IV SOLN
INTRAVENOUS | Status: AC
  Filled 2020-10-06: qty 100

## 2020-10-06 MED ORDER — ONDANSETRON HCL 4 MG/2ML IJ SOLN: 4.0000 mg | Freq: Four times a day (QID) | INTRAMUSCULAR | Status: DC | PRN

## 2020-10-06 MED ORDER — AMLODIPINE BESYLATE 5 MG PO TABS
5.0000 mg | ORAL_TABLET | Freq: Every day | ORAL | Status: DC
  Administered 2020-10-06 – 2020-10-07 (×2): 5 mg via ORAL
  Filled 2020-10-06 (×2): qty 1

## 2020-10-06 MED ORDER — METHOCARBAMOL 500 MG PO TABS
500.0000 mg | ORAL_TABLET | Freq: Four times a day (QID) | ORAL | Status: DC | PRN
  Administered 2020-10-07 – 2020-10-08 (×2): 500 mg via ORAL
  Filled 2020-10-06 (×2): qty 1

## 2020-10-06 MED ORDER — FENTANYL CITRATE (PF) 100 MCG/2ML IJ SOLN
INTRAMUSCULAR | Status: DC | PRN
  Administered 2020-10-06: 25 ug via INTRAVENOUS
  Administered 2020-10-06: 100 ug via INTRAVENOUS

## 2020-10-06 MED ORDER — DOCUSATE SODIUM 100 MG PO CAPS
100.0000 mg | ORAL_CAPSULE | Freq: Two times a day (BID) | ORAL | Status: DC
  Administered 2020-10-06 – 2020-10-08 (×4): 100 mg via ORAL
  Filled 2020-10-06 (×4): qty 1

## 2020-10-06 MED ORDER — PHENYLEPHRINE 40 MCG/ML (10ML) SYRINGE FOR IV PUSH (FOR BLOOD PRESSURE SUPPORT)
PREFILLED_SYRINGE | INTRAVENOUS | Status: DC | PRN
  Administered 2020-10-06: 80 ug via INTRAVENOUS
  Administered 2020-10-06: 120 ug via INTRAVENOUS

## 2020-10-06 MED ORDER — METHOCARBAMOL 500 MG IVPB - SIMPLE MED
INTRAVENOUS | Status: AC
  Filled 2020-10-06: qty 50

## 2020-10-06 MED ORDER — FENTANYL CITRATE (PF) 100 MCG/2ML IJ SOLN
25.0000 ug | INTRAMUSCULAR | Status: DC | PRN
  Administered 2020-10-06 (×4): 25 ug via INTRAVENOUS

## 2020-10-06 MED ORDER — METHOCARBAMOL 500 MG IVPB - SIMPLE MED
500.0000 mg | Freq: Four times a day (QID) | INTRAVENOUS | Status: DC | PRN
  Administered 2020-10-06: 500 mg via INTRAVENOUS
  Filled 2020-10-06: qty 50

## 2020-10-06 MED ORDER — ONDANSETRON HCL 4 MG/2ML IJ SOLN
INTRAMUSCULAR | Status: DC | PRN
  Administered 2020-10-06: 4 mg via INTRAVENOUS

## 2020-10-06 MED ORDER — CEFAZOLIN SODIUM-DEXTROSE 2-4 GM/100ML-% IV SOLN
2.0000 g | INTRAVENOUS | Status: AC
  Administered 2020-10-06: 2 g via INTRAVENOUS
  Filled 2020-10-06: qty 100

## 2020-10-06 MED ORDER — PHENYLEPHRINE HCL-NACL 10-0.9 MG/250ML-% IV SOLN
INTRAVENOUS | Status: DC | PRN
  Administered 2020-10-06: 40 ug/min via INTRAVENOUS

## 2020-10-06 MED ORDER — METOCLOPRAMIDE HCL 5 MG PO TABS
5.0000 mg | ORAL_TABLET | Freq: Three times a day (TID) | ORAL | Status: DC | PRN
  Filled 2020-10-06: qty 2

## 2020-10-06 MED ORDER — DEXAMETHASONE SODIUM PHOSPHATE 10 MG/ML IJ SOLN
INTRAMUSCULAR | Status: DC | PRN
  Administered 2020-10-06: 4 mg via INTRAVENOUS

## 2020-10-06 MED ORDER — CHLORHEXIDINE GLUCONATE 4 % EX LIQD: 60.0000 mL | Freq: Once | CUTANEOUS | Status: DC

## 2020-10-06 MED ORDER — LIDOCAINE 2% (20 MG/ML) 5 ML SYRINGE
INTRAMUSCULAR | Status: DC | PRN
  Administered 2020-10-06: 60 mg via INTRAVENOUS

## 2020-10-06 SURGICAL SUPPLY — 42 items
BAG DECANTER FOR FLEXI CONT (MISCELLANEOUS) IMPLANT
BAG ZIPLOCK 12X15 (MISCELLANEOUS) IMPLANT
BLADE SAG 18X100X1.27 (BLADE) ×2 IMPLANT
COVER PERINEAL POST (MISCELLANEOUS) ×2 IMPLANT
COVER SURGICAL LIGHT HANDLE (MISCELLANEOUS) ×2 IMPLANT
COVER WAND RF STERILE (DRAPES) IMPLANT
CUP ACETBLR 48 OD SECTOR II (Hips) ×1 IMPLANT
DERMABOND ADVANCED (GAUZE/BANDAGES/DRESSINGS) ×1
DERMABOND ADVANCED .7 DNX12 (GAUZE/BANDAGES/DRESSINGS) ×1 IMPLANT
DRAPE STERI IOBAN 125X83 (DRAPES) ×2 IMPLANT
DRAPE U-SHAPE 47X51 STRL (DRAPES) ×4 IMPLANT
DRESSING AQUACEL AG SP 3.5X10 (GAUZE/BANDAGES/DRESSINGS) ×1 IMPLANT
DRSG AQUACEL AG SP 3.5X10 (GAUZE/BANDAGES/DRESSINGS) ×2
DURAPREP 26ML APPLICATOR (WOUND CARE) ×2 IMPLANT
ELECT REM PT RETURN 15FT ADLT (MISCELLANEOUS) ×2 IMPLANT
ELIMINATOR HOLE APEX DEPUY (Hips) ×1 IMPLANT
GLOVE ORTHO TXT STRL SZ7.5 (GLOVE) ×4 IMPLANT
GLOVE SURG ENC MOIS LTX SZ6 (GLOVE) ×4 IMPLANT
GLOVE SURG LTX SZ8 (GLOVE) ×4 IMPLANT
GLOVE SURG UNDER POLY LF SZ6.5 (GLOVE) ×2 IMPLANT
GLOVE SURG UNDER POLY LF SZ7.5 (GLOVE) ×2 IMPLANT
GOWN STRL REUS W/TWL LRG LVL3 (GOWN DISPOSABLE) ×4 IMPLANT
HEAD FEM STD 32X+1 STRL (Hips) ×1 IMPLANT
HOLDER FOLEY CATH W/STRAP (MISCELLANEOUS) ×2 IMPLANT
KIT TURNOVER KIT A (KITS) ×2 IMPLANT
PACK ANTERIOR HIP CUSTOM (KITS) ×2 IMPLANT
PENCIL SMOKE EVACUATOR (MISCELLANEOUS) ×1 IMPLANT
PINN ALTRX NEUT ID X OD 32X48 ×1 IMPLANT
SCREW 6.5MMX25MM (Screw) ×1 IMPLANT
STEM FEM ACTIS HIGH SZ7 (Stem) ×1 IMPLANT
STRIP CLOSURE SKIN 1/2X4 (GAUZE/BANDAGES/DRESSINGS) ×1 IMPLANT
SUT MNCRL AB 4-0 PS2 18 (SUTURE) ×2 IMPLANT
SUT STRATAFIX 0 PDS 27 VIOLET (SUTURE) ×2
SUT VIC AB 1 CT1 36 (SUTURE) ×6 IMPLANT
SUT VIC AB 2-0 CT1 27 (SUTURE) ×2
SUT VIC AB 2-0 CT1 TAPERPNT 27 (SUTURE) ×2 IMPLANT
SUT VIC AB 3-0 SH 27 (SUTURE) ×1
SUT VIC AB 3-0 SH 27X BRD (SUTURE) IMPLANT
SUTURE STRATFX 0 PDS 27 VIOLET (SUTURE) ×1 IMPLANT
TRAY FOLEY MTR SLVR 14FR STAT (SET/KITS/TRAYS/PACK) ×1 IMPLANT
TUBE SUCTION HIGH CAP CLEAR NV (SUCTIONS) ×4 IMPLANT
WATER STERILE IRR 1000ML POUR (IV SOLUTION) ×2 IMPLANT

## 2020-10-06 NOTE — Consult Note (Signed)
Reason for Consult: right hip fracture Referring Physician: Alanda Slim, MD  Kristen Lamb is an 87 y.o. female.  HPI: Kristen Lamb is a 87 y.o. female, with PMH of RA, hypertension, CVA, paroxysmal A. fib (not on anticoagulation), CHF, who presented to the ER on 10/05/2020 with fall and was found to have head laceration and right hip fracture.  Patient was at her daughter's house when she had a mechanical fall, falling backwards while trying to remove her shoe and hit her head and landed on the right side of her body.  She has had a few falls over the past several months.  She had bleeding in the back of her head.  Denies loss of consciousness.  She is not on anticoagulation for A. fib.  It appears she is possibly on Plavix. She lives in [place] but was visiting her daughter as her daughter has been having some mental issues.  She was able to walk to walk after the incident.  No significant pain.  Of note she is follow-up with an orthopedic in her hometown for arthritis and there was tentative plans for possible hip replacement.  Due to fall and bleeding from the back of her head, she presented to the ER for evaluation.  We discussed the history of onset of right hip pain from December of 2021.  She has been evaluated for this and has been giving consideration of having it replaced due to pain.  It was exacerbated by recent falls one of them about 4 weeks ago when she recognized that it became more painful and challenging to walk.  Past Medical History:  Diagnosis Date  . A-fib (HCC)   . CHF (congestive heart failure) (HCC)   . Hypertension   . Rheumatoid arthritis (HCC)   . Stroke [facility])       No family history on file.  Social History:  reports that she has quit smoking. She has never used smokeless tobacco. She reports that she does not drink alcohol and does not use drugs.  Allergies:  Allergies  Allergen Reactions  . Morphine And Related   . Penicillins   . Shellfish Allergy      Medications:  I have reviewed the patient's current medications. Scheduled: . atorvastatin  20 mg Oral QHS  . metoprolol tartrate  100 mg Oral BID  . mirtazapine  7.5 mg Oral QHS    Results for orders placed or performed during the hospital encounter of 10/04/20 (from the past 24 hour(s))  CBC     Status: Abnormal   Collection Time: 10/06/20  2:41 AM  Result Value Ref Range   WBC 5.9 4.0 - 10.5 K/uL   RBC 3.75 (L) 3.87 - 5.11 MIL/uL   Hemoglobin 11.5 (L) 12.0 - 15.0 g/dL   HCT 16.1 09.6 - 04.5 %   MCV 97.6 80.0 - 100.0 fL   MCH 30.7 26.0 - 34.0 pg   MCHC 31.4 30.0 - 36.0 g/dL   RDW 40.9 81.1 - 91.4 %   Platelets 176 150 - 400 K/uL   nRBC 0.0 0.0 - 0.2 %  Basic metabolic panel     Status: Abnormal   Collection Time: 10/06/20  2:41 AM  Result Value Ref Range   Sodium 139 135 - 145 mmol/L   Potassium 4.1 3.5 - 5.1 mmol/L   Chloride 104 98 - 111 mmol/L   CO2 28 22 - 32 mmol/L   Glucose, Bld 97 70 - 99 mg/dL   BUN 16 8 -  23 mg/dL   Creatinine, Ser 4.03 0.44 - 1.00 mg/dL   Calcium 8.4 (L) 8.9 - 10.3 mg/dL   GFR, Estimated >47 >42 mL/min   Anion gap 7 5 - 15  Magnesium     Status: None   Collection Time: 10/06/20  2:41 AM  Result Value Ref Range   Magnesium 2.2 1.7 - 2.4 mg/dL  Phosphorus     Status: None   Collection Time: 10/06/20  2:41 AM  Result Value Ref Range   Phosphorus 3.1 2.5 - 4.6 mg/dL  TSH     Status: None   Collection Time: 10/06/20  2:41 AM  Result Value Ref Range   TSH 2.469 0.350 - 4.500 uIU/mL    X-ray: CLINICAL DATA:  Fall, right hip pain  EXAM: PELVIS - 1-2 VIEW  COMPARISON:  None.  FINDINGS: Right femoral neck fracture noted. No subluxation or dislocation. Mild degenerative changes in the hips bilaterally. SI joints symmetric and unremarkable.  IMPRESSION: Right femoral neck fracture.   Electronically Signed   By: Charlett Nose M.D.  CLINICAL DATA:  87 year old female with concern for right hip fracture.  EXAM: CT OF  THE RIGHT HIP WITHOUT CONTRAST  TECHNIQUE: Multidetector CT imaging of the right hip was performed according to the standard protocol. Multiplanar CT image reconstructions were also generated.  COMPARISON:  Pelvic radiograph dated 10/04/2020.  FINDINGS: Bones/Joint/Cartilage  There is a nondisplaced fracture of the right femoral neck with mild impaction. The bones are osteopenic. No dislocation. Mild arthritic changes of the right hip. No joint effusion.  Ligaments  Suboptimally assessed by CT.  Muscles and Tendons  No acute findings.  No intramuscular fluid collection or hematoma.  Soft tissues  Diffuse colonic diverticulosis. The remainder of the soft tissues are unremarkable.  IMPRESSION: Nondisplaced fracture of the right femoral neck.   Electronically Signed   By: Elgie Collard M.D.  ROS: As per HPI  Blood pressure (!) 146/66, pulse 63, temperature 97.9 F (36.6 C), resp. rate 16, height 5\' 1"  (1.549 m), weight 34 kg, SpO2 94 %.  Physical Exam: Very pleasant female  Requires hearing aid to hear better General appearance:Well-developed, adult female, alert and inno acutedistress . Eyes:Anicteric, conjunctiva pink, lids and lashes normal. PERRL.  ENT:No nasal deformity, discharge, epistaxis.  Hard of hearing. OP moist without lesions.   Posterior scalp laceration with staples in place, no active bleeding Neck:No neck masses. Trachea midline. No thyromegaly/tenderness. Lymph:No cervical or supraclavicular lymphadenopathy. Skin:Warm and dry. No jaundice. No suspicious rashes or lesions. Cardiac:RRR, nl S1-S2, no murmurs appreciated. No LE edema. Radial and pedalpulses 2+ and symmetric. Respiratory:Normal respiratory rate and rhythm. CTAB without rales or wheezes. Abdomen:Abdomen soft. No tenderness with palpation. No ascites, distension, hepatosplenomegaly.  MSK: significant ulnar drift related to her RA Actually able to  mobilize fairly well given fracture Slight shortening of her right LE History of right total knee replacement Neuro:Cranial nerves 2 through 12 grossly intact. Sensation intact to light touch. Speech is fluent.  Psych:Sensorium intact and responding to questions, attention normal. Behavior appropriate. Judgment and insight appear normal.  Assessment/Plan: 1. Right hip fracture with 3-4 month history of right hip pain  Plan: We reviewed her history with this right hip We discussed surgical options of percutaneous screw fixation versus total hip replacement.  We reviewed the pros and cons of each with specific concerns of malunion (shortening) versus nonunion and the need for future surgery. Given this discussion she wishes to proceed with total hip replacement  to treat not only the relatively acute issues but also the subacute pain she has been dealing with. Plan to address today NPO Orders and consent placed  Shelda Pal 10/06/2020, 6:54 AM

## 2020-10-06 NOTE — Interval H&P Note (Signed)
History and Physical Interval Note:  10/06/2020 4:33 PM  Kristen Lamb  has presented today for surgery, with the diagnosis of Right Femural Neck Fracture..  The various methods of treatment have been discussed with the patient and family. After consideration of risks, benefits and other options for treatment, the patient has consented to  Procedure(s): Right Femural Neck Fracture (Right) as a surgical intervention.  The patient's history has been reviewed, patient examined, no change in status, stable for surgery.  I have reviewed the patient's chart and labs.  Questions were answered to the patient's satisfaction.     Shelda Pal

## 2020-10-06 NOTE — Progress Notes (Signed)
PHARMACY NOTE:  ANTIMICROBIAL RENAL DOSAGE ADJUSTMENT  Current antimicrobial regimen includes a mismatch between antimicrobial dosage and estimated renal function.  As per policy approved by the Pharmacy & Therapeutics and Medical Executive Committees, the antimicrobial dosage will be adjusted accordingly.  Current antimicrobial dosage: Ancef 2g q8h x2  Indication: post-op prophylaxis  Renal Function:  Estimated Creatinine Clearance: 25.6 mL/min (by C-G formula based on SCr of 0.62 mg/dL). []      On intermittent HD, scheduled: []      On CRRT    Antimicrobial dosage has been changed to:  Ancef 2gm IV q12h x1 for 24h antibiotic coverage post-op  Additional comments:   Thank you for allowing pharmacy to be a part of this patient's care.  , [facility] 10/06/2020 6:39 PM

## 2020-10-06 NOTE — H&P (View-Only) (Signed)
Reason for Consult: right hip fracture Referring Physician: Alanda Slim, MD  Kristen Lamb is an 87 y.o. female.  HPI: Kristen Lamb is a 87 y.o. female, with PMH of RA, hypertension, CVA, paroxysmal A. fib (not on anticoagulation), CHF, who presented to the ER on 10/05/2020 with fall and was found to have head laceration and right hip fracture.  Patient was at her daughter's house when she had a mechanical fall, falling backwards while trying to remove her shoe and hit her head and landed on the right side of her body.  She has had a few falls over the past several months.  She had bleeding in the back of her head.  Denies loss of consciousness.  She is not on anticoagulation for A. fib.  It appears she is possibly on Plavix. She lives in [place] but was visiting her daughter as her daughter has been having some mental issues.  She was able to walk to walk after the incident.  No significant pain.  Of note she is follow-up with an orthopedic in her hometown for arthritis and there was tentative plans for possible hip replacement.  Due to fall and bleeding from the back of her head, she presented to the ER for evaluation.  We discussed the history of onset of right hip pain from December of 2021.  She has been evaluated for this and has been giving consideration of having it replaced due to pain.  It was exacerbated by recent falls one of them about 4 weeks ago when she recognized that it became more painful and challenging to walk.  Past Medical History:  Diagnosis Date  . A-fib (HCC)   . CHF (congestive heart failure) (HCC)   . Hypertension   . Rheumatoid arthritis (HCC)   . Stroke [facility])       No family history on file.  Social History:  reports that she has quit smoking. She has never used smokeless tobacco. She reports that she does not drink alcohol and does not use drugs.  Allergies:  Allergies  Allergen Reactions  . Morphine And Related   . Penicillins   . Shellfish Allergy      Medications:  I have reviewed the patient's current medications. Scheduled: . atorvastatin  20 mg Oral QHS  . metoprolol tartrate  100 mg Oral BID  . mirtazapine  7.5 mg Oral QHS    Results for orders placed or performed during the hospital encounter of 10/04/20 (from the past 24 hour(s))  CBC     Status: Abnormal   Collection Time: 10/06/20  2:41 AM  Result Value Ref Range   WBC 5.9 4.0 - 10.5 K/uL   RBC 3.75 (L) 3.87 - 5.11 MIL/uL   Hemoglobin 11.5 (L) 12.0 - 15.0 g/dL   HCT 16.1 09.6 - 04.5 %   MCV 97.6 80.0 - 100.0 fL   MCH 30.7 26.0 - 34.0 pg   MCHC 31.4 30.0 - 36.0 g/dL   RDW 40.9 81.1 - 91.4 %   Platelets 176 150 - 400 K/uL   nRBC 0.0 0.0 - 0.2 %  Basic metabolic panel     Status: Abnormal   Collection Time: 10/06/20  2:41 AM  Result Value Ref Range   Sodium 139 135 - 145 mmol/L   Potassium 4.1 3.5 - 5.1 mmol/L   Chloride 104 98 - 111 mmol/L   CO2 28 22 - 32 mmol/L   Glucose, Bld 97 70 - 99 mg/dL   BUN 16 8 -  23 mg/dL   Creatinine, Ser 4.03 0.44 - 1.00 mg/dL   Calcium 8.4 (L) 8.9 - 10.3 mg/dL   GFR, Estimated >47 >42 mL/min   Anion gap 7 5 - 15  Magnesium     Status: None   Collection Time: 10/06/20  2:41 AM  Result Value Ref Range   Magnesium 2.2 1.7 - 2.4 mg/dL  Phosphorus     Status: None   Collection Time: 10/06/20  2:41 AM  Result Value Ref Range   Phosphorus 3.1 2.5 - 4.6 mg/dL  TSH     Status: None   Collection Time: 10/06/20  2:41 AM  Result Value Ref Range   TSH 2.469 0.350 - 4.500 uIU/mL    X-ray: CLINICAL DATA:  Fall, right hip pain  EXAM: PELVIS - 1-2 VIEW  COMPARISON:  None.  FINDINGS: Right femoral neck fracture noted. No subluxation or dislocation. Mild degenerative changes in the hips bilaterally. SI joints symmetric and unremarkable.  IMPRESSION: Right femoral neck fracture.   Electronically Signed   By: Charlett Nose M.D.  CLINICAL DATA:  87 year old female with concern for right hip fracture.  EXAM: CT OF  THE RIGHT HIP WITHOUT CONTRAST  TECHNIQUE: Multidetector CT imaging of the right hip was performed according to the standard protocol. Multiplanar CT image reconstructions were also generated.  COMPARISON:  Pelvic radiograph dated 10/04/2020.  FINDINGS: Bones/Joint/Cartilage  There is a nondisplaced fracture of the right femoral neck with mild impaction. The bones are osteopenic. No dislocation. Mild arthritic changes of the right hip. No joint effusion.  Ligaments  Suboptimally assessed by CT.  Muscles and Tendons  No acute findings.  No intramuscular fluid collection or hematoma.  Soft tissues  Diffuse colonic diverticulosis. The remainder of the soft tissues are unremarkable.  IMPRESSION: Nondisplaced fracture of the right femoral neck.   Electronically Signed   By: Elgie Collard M.D.  ROS: As per HPI  Blood pressure (!) 146/66, pulse 63, temperature 97.9 F (36.6 C), resp. rate 16, height 5\' 1"  (1.549 m), weight 34 kg, SpO2 94 %.  Physical Exam: Very pleasant female  Requires hearing aid to hear better General appearance:Well-developed, adult female, alert and inno acutedistress . Eyes:Anicteric, conjunctiva pink, lids and lashes normal. PERRL.  ENT:No nasal deformity, discharge, epistaxis.  Hard of hearing. OP moist without lesions.   Posterior scalp laceration with staples in place, no active bleeding Neck:No neck masses. Trachea midline. No thyromegaly/tenderness. Lymph:No cervical or supraclavicular lymphadenopathy. Skin:Warm and dry. No jaundice. No suspicious rashes or lesions. Cardiac:RRR, nl S1-S2, no murmurs appreciated. No LE edema. Radial and pedalpulses 2+ and symmetric. Respiratory:Normal respiratory rate and rhythm. CTAB without rales or wheezes. Abdomen:Abdomen soft. No tenderness with palpation. No ascites, distension, hepatosplenomegaly.  MSK: significant ulnar drift related to her RA Actually able to  mobilize fairly well given fracture Slight shortening of her right LE History of right total knee replacement Neuro:Cranial nerves 2 through 12 grossly intact. Sensation intact to light touch. Speech is fluent.  Psych:Sensorium intact and responding to questions, attention normal. Behavior appropriate. Judgment and insight appear normal.  Assessment/Plan: 1. Right hip fracture with 3-4 month history of right hip pain  Plan: We reviewed her history with this right hip We discussed surgical options of percutaneous screw fixation versus total hip replacement.  We reviewed the pros and cons of each with specific concerns of malunion (shortening) versus nonunion and the need for future surgery. Given this discussion she wishes to proceed with total hip replacement  to treat not only the relatively acute issues but also the subacute pain she has been dealing with. Plan to address today NPO Orders and consent placed  Kiyonna Tortorelli D Breyah Akhter 10/06/2020, 6:54 AM      

## 2020-10-06 NOTE — Op Note (Signed)
NAMESelia Lamb NO.: [id number]      MEDICAL RECORD NO.: [id number]      FACILITY:  [facility]      PHYSICIAN:  Shelda Pal  DATE OF BIRTH:  October 13, 1929     DATE OF PROCEDURE:  10/06/2020                                 OPERATIVE REPORT         PREOPERATIVE DIAGNOSIS: Right hip femoral neck fracture.      POSTOPERATIVE DIAGNOSIS:  Right hip femoral neck fracture.      PROCEDURE:  Right total hip replacement through an anterior approach   utilizing DePuy THR system, component size 69mm pinnacle cup, a size 32+4 neutral   Altrex liner, a size 7 Hi Actis stem with a 32+1 Articuleze metal head ball.      SURGEON:  Madlyn Frankel. Charlann Boxer, M.D.      ASSISTANT:  Dennie Bible, PA-C     ANESTHESIA:  General.      SPECIMENS:  None.      COMPLICATIONS:  None.      BLOOD LOSS:  250 cc     DRAINS:  None.      INDICATION OF THE PROCEDURE:  Kristen Lamb is a 87 y.o. female who had   presented to office for evaluation of right hip pain.  She has been having pain requiring treatment for right hip pain since December 2021.  This has been exacerbated by recent falls and increasing challenges with mobilization.  Radiographs and CT scan reveal right femoral neck fracture.  After discussing with the pros and cons of options from percutaneous screw fixation verus total hip replacement she opts for total hip replacement to minimize risk of needed another surgery.  Specific risks of infection, DVT, component   failure, dislocation, neurovascular injury, and need for revision surgery were reviewed.     PROCEDURE IN DETAIL:  The patient was brought to operative theater.   Once adequate anesthesia, preoperative antibiotics, 2 gm of Ancef, 1 gm of Tranexamic Acid, and 10 mg of Decadron were administered, the patient was positioned supine on the [employer] table.  Once the patient was safely positioned with adequate padding of boney prominences we predraped  out the hip, and used fluoroscopy to confirm orientation of the pelvis.      The right hip was then prepped and draped from proximal iliac crest to   mid thigh with a shower curtain technique.      Time-out was performed identifying the patient, planned procedure, and the appropriate extremity.     An incision was then made 2 cm lateral to the   anterior superior iliac spine extending over the orientation of the   tensor fascia lata muscle and sharp dissection was carried down to the   fascia of the muscle.      The fascia was then incised.  The muscle belly was identified and swept   laterally and retractor placed along the superior neck.  Following   cauterization of the circumflex vessels and removing some pericapsular   fat, a second cobra retractor was placed on the inferior neck.  A T-capsulotomy was made along the line of the   superior neck to the trochanteric fossa, then extended proximally and  distally.  Tag sutures were placed and the retractors were then placed   intracapsular.  We then identified the trochanteric fossa and   orientation of my neck cut and then made a neck osteotomy with the femur on traction.  The femoral   head and fractured neck segment were removed without difficulty or complication.  Traction was let   off and retractors were placed posterior and anterior around the   acetabulum.      The labrum and foveal tissue were debrided.  I began reaming with a 44 mm   reamer and reamed up to 47 mm reamer with good bony bed preparation and a 48 mm  cup was chosen.  The final 48 mm Pinnacle cup was then impacted under fluoroscopy to confirm the depth of penetration and orientation with respect to   Abduction and forward flexion.  A screw was placed into the ilium followed by the hole eliminator.  The final   32+4 neutral Altrex liner was impacted with good visualized rim fit.  The cup was positioned anatomically within the acetabular portion of the pelvis.       At this point, the femur was rolled to 100 degrees.  Further capsule was   released off the inferior aspect of the femoral neck.  I then   released the superior capsule proximally.  With the leg in a neutral position the hook was placed laterally   along the femur under the vastus lateralis origin and elevated manually and then held in position using the hook attachment on the bed.  The leg was then extended and adducted with the leg rolled to 100   degrees of external rotation.  Retractors were placed along the medial calcar and posteriorly over the greater trochanter.  Once the proximal femur was fully   exposed, I used a box osteotome to set orientation.  I then began   broaching with the starting chili pepper broach and passed this by hand and then broached up to 7.  With the 7 broach in place I chose a high offset neck versus standard neck and did several trial reductions.  The offset was appropriate, leg lengths   appeared to be equal best matched with the +1 head ball trial confirmed radiographically.   Given these findings, I went ahead and dislocated the hip, repositioned all   retractors and positioned the right hip in the extended and abducted position.  The final 7 Hi Actis stem was   chosen and it was impacted down to the level of neck cut.  Based on this   and the trial reductions, a final 32+1 Articuleze metal head ball was chosen and   impacted onto a clean and dry trunnion, and the hip was reduced.  The   hip had been irrigated throughout the case again at this point.  I did   reapproximate the superior capsular leaflet to the anterior leaflet   using #1 Vicryl.  The fascia of the   tensor fascia lata muscle was then reapproximated using #1 Vicryl and #0 Stratafix sutures.  The   remaining wound was closed with 2-0 Vicryl and running 4-0 Monocryl.   The hip was cleaned, dried, and dressed sterilely using Dermabond and   Aquacel dressing.  The patient was then brought   to  recovery room in stable condition tolerating the procedure well.    Dennie Bible, PA-C was present for the entirety of the case involved from   preoperative positioning, perioperative  retractor management, general   facilitation of the case, as well as primary wound closure as assistant.            Madlyn Frankel Charlann Boxer, M.D.        10/06/2020 4:36 PM

## 2020-10-06 NOTE — Progress Notes (Signed)
PROGRESS NOTE  Kristen Lamb GYJ:[id number] DOB: 1930/01/29   PCP: No primary care provider on file.  Patient is from: Home.  Independent at baseline. Here from [place] area to take care of her daughter before fall.   DOA: 10/04/2020 LOS: 1  Chief complaints: Fall  Brief Narrative / Interim history: 87 year old F with PMH of RA, prior CVA, paroxysmal A. fib not on AC, LBBB and HTN came to ED after accidental fall and scalp laceration at her daughter's house.  No LOC.  She was found to have nondisplaced right femoral neck fracture.  Initially, patient was to leave AMA to have her surgery done close to home in [place], [place] but changed her mind to stay and have surgery done here.  [facility], Dr. Charlann Boxer consulted. Of note, she is followed by orthopedic surgeon in her hometown for arthritis and there was a plan for possible hip replacement in May 2022.  Subjective: Seen and examined earlier this morning.  No major events overnight of this morning.  No complaints.  She is not in pain.  She reports having a good sleep.  Denies chest pain, dyspnea, GI or UTI symptoms.  Objective: Vitals:   10/05/20 1414 10/05/20 2100 10/06/20 0502 10/06/20 0847  BP: (!) 180/86 (!) 161/66 (!) 146/66 (!) 149/56  Pulse: 96 61 63 60  Resp: 20 16 16 17   Temp: 98 F (36.7 C) 98.1 F (36.7 C) 97.9 F (36.6 C) 98 F (36.7 C)  TempSrc: Oral   Oral  SpO2:  98% 94% 97%  Weight: 34 kg     Height: 5\' 1"  (1.549 m)       Intake/Output Summary (Last 24 hours) at 10/06/2020 1158 Last data filed at 10/06/2020 0957 Gross per 24 hour  Intake 130 ml  Output 1350 ml  Net -1220 ml   Filed Weights   10/05/20 1414  Weight: 34 kg    Examination:  GENERAL: No apparent distress.  Nontoxic. HEENT: MMM.  Vision grossly intact.  Diminished hearing. Scalp laceration NECK: Supple.  No apparent JVD.  RESP: On RA.  No IWOB.  Fair aeration bilaterally. CVS:  RRR. Heart sounds normal.  ABD/GI/GU: BS+. Abd soft,  NTND.  MSK/EXT:  Moves extremities. Arthritic changes in her hands.  SKIN: no apparent skin lesion or wound NEURO: Awake, alert and oriented appropriately.  No apparent focal neuro deficit. PSYCH: Calm. Normal affect.  Procedures:  None yet  Microbiology summarized: COVID-19 and influenza PCR nonreactive.  Assessment & Plan: Accidental fall ground-level fall at home Scalp laceration/hematoma-repaired with 3 staples None displaced right femoral neck fracture-likely due to fall.  -Plan for total hip replacement by orthopedic surgery today -Stable from cardiopulmonary standpoint.  She has no exertional angina or dyspnea.  Recent TTE in 01/2020 reassuring.  She has A. fib but rate controlled.  She is not on ACE likely due to fall risk.  Paroxysmal A. fib: In sinus rhythm.  She is on metoprolol at home.  Not on AC.  HR in low 60s. -Reduced home metoprolol to 50 mg twice daily with parameters.  Chronic LBBB-no anginal symptoms. -Continue metoprolol as above  Essential hypertension: SBP in 140s. -Reduce metoprolol as above -Added low-dose amlodipine  Rheumatoid arthritis with significant deformities in her hands -Gets Orencia injection outpatient.   Underweight Body mass index is 14.17 kg/m.  -Consult dietitian       DVT prophylaxis:  SCDs Start: 10/05/20 02/2020  Code Status: Full code Family Communication: Updated patient's daughter over  the phone Level of care: Med-Surg Status is: Inpatient  Remains inpatient appropriate because:Ongoing diagnostic testing needed not appropriate for outpatient work up and Inpatient level of care appropriate due to severity of illness   Dispo: The patient is from: Home              Anticipated d/c is to: To be decided              Patient currently is not medically stable to d/c.   Difficult to place patient No       Consultants:  Orthopedic surgery, Dr. Charlann Boxer   Sch Meds:  Scheduled Meds:  amLODipine  5 mg Oral Daily    atorvastatin  20 mg Oral QHS   chlorhexidine  60 mL Topical Once   metoprolol tartrate  50 mg Oral BID   mirtazapine  7.5 mg Oral QHS   povidone-iodine  2 application Topical Once   povidone-iodine  2 application Topical Once   Continuous Infusions:   ceFAZolin (ANCEF) IV     tranexamic acid     PRN Meds:.acetaminophen **OR** acetaminophen, ondansetron **OR** ondansetron (ZOFRAN) IV  Antimicrobials: Anti-infectives (From admission, onward)   Start     Dose/Rate Route Frequency Ordered Stop   10/06/20 0845  ceFAZolin (ANCEF) IVPB 2g/100 mL premix        2 g 200 mL/hr over 30 Minutes Intravenous On call to O.R. 10/06/20 0756 10/07/20 0559       I have personally reviewed the following labs and images: CBC: Recent Labs  Lab 10/05/20 0022 10/06/20 0241  WBC 7.4 5.9  NEUTROABS 4.8  --   HGB 11.6* 11.5*  HCT 36.1 36.6  MCV 97.8 97.6  PLT 175 176   BMP &GFR Recent Labs  Lab 10/05/20 0022 10/06/20 0241  NA 137 139  K 4.5 4.1  CL 100 104  CO2 26 28  GLUCOSE 122* 97  BUN 29* 16  CREATININE 0.84 0.62  CALCIUM 8.4* 8.4*  MG  --  2.2  PHOS  --  3.1   Estimated Creatinine Clearance: 25.6 mL/min (by C-G formula based on SCr of 0.62 mg/dL). Liver & Pancreas: No results for input(s): AST, ALT, ALKPHOS, BILITOT, PROT, ALBUMIN in the last 168 hours. No results for input(s): LIPASE, AMYLASE in the last 168 hours. No results for input(s): AMMONIA in the last 168 hours. Diabetic: No results for input(s): HGBA1C in the last 72 hours. No results for input(s): GLUCAP in the last 168 hours. Cardiac Enzymes: No results for input(s): CKTOTAL, CKMB, CKMBINDEX, TROPONINI in the last 168 hours. No results for input(s): PROBNP in the last 8760 hours. Coagulation Profile: Recent Labs  Lab 10/05/20 0022  INR 1.1   Thyroid Function Tests: Recent Labs    10/06/20 0241  TSH 2.469   Lipid Profile: No results for input(s): CHOL, HDL, LDLCALC, TRIG, CHOLHDL, LDLDIRECT in  the last 72 hours. Anemia Panel: No results for input(s): VITAMINB12, FOLATE, FERRITIN, TIBC, IRON, RETICCTPCT in the last 72 hours. Urine analysis: No results found for: COLORURINE, APPEARANCEUR, LABSPEC, PHURINE, GLUCOSEU, HGBUR, BILIRUBINUR, KETONESUR, PROTEINUR, UROBILINOGEN, NITRITE, LEUKOCYTESUR Sepsis Labs: Invalid input(s): PROCALCITONIN, LACTICIDVEN  Microbiology: Recent Results (from the past 240 hour(s))  Resp Panel by RT-PCR (Flu A&B, Covid) Nasopharyngeal Swab     Status: None   Collection Time: 10/05/20  1:45 AM   Specimen: Nasopharyngeal Swab; Nasopharyngeal(NP) swabs in vial transport medium  Result Value Ref Range Status   SARS Coronavirus 2 by RT PCR NEGATIVE NEGATIVE  Final    Comment: (NOTE) SARS-CoV-2 target nucleic acids are NOT DETECTED.  The SARS-CoV-2 RNA is generally detectable in upper respiratory specimens during the acute phase of infection. The lowest concentration of SARS-CoV-2 viral copies this assay can detect is 138 copies/mL. A negative result does not preclude SARS-Cov-2 infection and should not be used as the sole basis for treatment or other patient management decisions. A negative result may occur with  improper specimen collection/handling, submission of specimen other than nasopharyngeal swab, presence of viral mutation(s) within the areas targeted by this assay, and inadequate number of viral copies(<138 copies/mL). A negative result must be combined with clinical observations, patient history, and epidemiological information. The expected result is Negative.  Fact Sheet for Patients:  BloggerCourse.com  Fact Sheet for Healthcare Providers:  SeriousBroker.it  This test is no t yet approved or cleared by the [place] FDA and  has been authorized for detection and/or diagnosis of SARS-CoV-2 by FDA under an Emergency Use Authorization (EUA). This EUA will remain  in effect (meaning this  test can be used) for the duration of the COVID-19 declaration under Section 564(b)(1) of the Act, 21 U.S.C.section 360bbb-3(b)(1), unless the authorization is terminated  or revoked sooner.       Influenza A by PCR NEGATIVE NEGATIVE Final   Influenza B by PCR NEGATIVE NEGATIVE Final    Comment: (NOTE) The Xpert Xpress SARS-CoV-2/FLU/RSV plus assay is intended as an aid in the diagnosis of influenza from Nasopharyngeal swab specimens and should not be used as a sole basis for treatment. Nasal washings and aspirates are unacceptable for Xpert Xpress SARS-CoV-2/FLU/RSV testing.  Fact Sheet for Patients: BloggerCourse.com  Fact Sheet for Healthcare Providers: SeriousBroker.it  This test is not yet approved or cleared by the [place] FDA and has been authorized for detection and/or diagnosis of SARS-CoV-2 by FDA under an Emergency Use Authorization (EUA). This EUA will remain in effect (meaning this test can be used) for the duration of the COVID-19 declaration under Section 564(b)(1) of the Act, 21 U.S.C. section 360bbb-3(b)(1), unless the authorization is terminated or revoked.  Performed at [facility], [address] [place], [place] [address]     Radiology Studies: No results found.    Lasaro Primm T. Deondrea Aguado [facility]  If 7PM-7AM, please contact night-coverage [email or web address] 10/06/2020, 11:58 AM

## 2020-10-06 NOTE — Transfer of Care (Signed)
Immediate Anesthesia Transfer of Care Note  Patient: Kristen Lamb  Procedure(s) Performed: Right Femural Neck Fracture (Right Hip)  Patient Location: PACU  Anesthesia Type:General  Level of Consciousness: awake and alert   Airway & Oxygen Therapy: Patient Spontanous Breathing and Patient connected to face mask oxygen  Post-op Assessment: Report given to RN and Post -op Vital signs reviewed and stable  Post vital signs: Reviewed and stable  Last Vitals:  Vitals Value Taken Time  BP 155/134 10/06/20 1839  Temp    Pulse 87 10/06/20 1840  Resp 33 10/06/20 1840  SpO2 100 % 10/06/20 1840  Vitals shown include unvalidated device data.  Last Pain:  Vitals:   10/06/20 1550  TempSrc:   PainSc: 0-No pain         Complications: No complications documented.

## 2020-10-06 NOTE — Anesthesia Procedure Notes (Signed)
Procedure Name: Intubation Date/Time: 10/06/2020 4:52 PM Performed by: Montel Clock, CRNA Pre-anesthesia Checklist: Patient identified, Emergency Drugs available, Suction available, Patient being monitored and Timeout performed Patient Re-evaluated:Patient Re-evaluated prior to induction Oxygen Delivery Method: Circle system utilized Preoxygenation: Pre-oxygenation with 100% oxygen Induction Type: IV induction Ventilation: Mask ventilation without difficulty Laryngoscope Size: Mac and 3 Grade View: Grade II Tube type: Oral Tube size: 6.5 mm Number of attempts: 1 Airway Equipment and Method: Stylet Placement Confirmation: ETT inserted through vocal cords under direct vision,  positive ETCO2 and breath sounds checked- equal and bilateral Secured at: 21 cm Tube secured with: Tape Dental Injury: Teeth and Oropharynx as per pre-operative assessment

## 2020-10-06 NOTE — Anesthesia Postprocedure Evaluation (Signed)
Anesthesia Post Note  Patient: Kristen Lamb  Procedure(s) Performed: Right Femural Neck Fracture (Right Hip)     Patient location during evaluation: PACU Anesthesia Type: General Level of consciousness: awake Pain management: pain level controlled Vital Signs Assessment: post-procedure vital signs reviewed and stable Respiratory status: spontaneous breathing, nonlabored ventilation, respiratory function stable and patient connected to nasal cannula oxygen Cardiovascular status: blood pressure returned to baseline and stable Postop Assessment: no apparent nausea or vomiting Anesthetic complications: no   No complications documented.  Last Vitals:  Vitals:   10/06/20 1930 10/06/20 1957  BP: (!) 153/64 (!) 144/66  Pulse: 87 92  Resp: 19 16  Temp: 36.5 C 36.6 C  SpO2: 97% 95%    Last Pain:  Vitals:   10/06/20 1957  TempSrc: Oral  PainSc:                  [name]

## 2020-10-06 NOTE — Anesthesia Preprocedure Evaluation (Addendum)
Anesthesia Evaluation  Patient identified by MRN, date of birth, ID band Patient awake    Reviewed: Allergy & Precautions, NPO status , Patient's Chart, lab work & pertinent test results  Airway Mallampati: II  TM Distance: >3 FB Neck ROM: Full    Dental no notable dental hx.    Pulmonary former smoker,    Pulmonary exam normal breath sounds clear to auscultation       Cardiovascular hypertension, Pt. on medications and Pt. on home beta blockers +CHF  Normal cardiovascular exam+ Valvular Problems/Murmurs  Rhythm:Regular Rate:Normal  ECG: SR, rate 68   Neuro/Psych CVA negative psych ROS   GI/Hepatic negative GI ROS, Neg liver ROS,   Endo/Other  negative endocrine ROS  Renal/GU negative Renal ROS     Musculoskeletal  (+) Arthritis , Rheumatoid disorders,    Abdominal   Peds  Hematology HLD   Anesthesia Other Findings Right Femural Neck Fracture  Reproductive/Obstetrics                            Anesthesia Physical Anesthesia Plan  ASA: III  Anesthesia Plan: General   Post-op Pain Management:    Induction: Intravenous  PONV Risk Score and Plan: 3 and Ondansetron, Dexamethasone and Treatment may vary due to age or medical condition  Airway Management Planned: LMA  Additional Equipment:   Intra-op Plan:   Post-operative Plan: Extubation in OR  Informed Consent: I have reviewed the patients History and Physical, chart, labs and discussed the procedure including the risks, benefits and alternatives for the proposed anesthesia with the patient or authorized representative who has indicated his/her understanding and acceptance.     Dental advisory given  Plan Discussed with: CRNA  Anesthesia Plan Comments:        Anesthesia Quick Evaluation

## 2020-10-06 NOTE — Anesthesia Procedure Notes (Signed)
Date/Time: 10/06/2020 6:33 PM Performed by: Minerva Ends, CRNA Oxygen Delivery Method: Simple face mask Placement Confirmation: positive ETCO2 and breath sounds checked- equal and bilateral Dental Injury: Teeth and Oropharynx as per pre-operative assessment

## 2020-10-06 NOTE — TOC Initial Note (Signed)
Transition of Care [facility]) - Initial/Assessment Note   Patient Details  Name: Kristen Lamb MRN: [id number] Date of Birth: Jun 01, 1930  Transition of Care [facility]) CM/SW Contact:    Ewing Schlein, LCSW Phone Number: 10/06/2020, 11:10 AM  Clinical Narrative: Patient is an 87 year old female who was admitted for closed right hip fracture. CSW completed assessment with patient. Per patient, she resides at home with her adult daughter. Current DME includes a walker and BSC; patient also has raised toilets in the home. CSW discussed SNF vs. HH and patient stated she will "go with what is recommended." TOC awaiting PT evaluation following surgery.  Expected Discharge Plan: Home w Home Health Services Barriers to Discharge: Continued Medical Work up  Patient Goals and CMS Choice Patient states their goals for this hospitalization and ongoing recovery are:: Discharge home with [facility] CMS Medicare.gov Compare Post Acute Care list provided to:: Patient Choice offered to / list presented to : Patient  Expected Discharge Plan and Services Expected Discharge Plan: Home w Home Health Services In-house Referral: Clinical Social Work Post Acute Care Choice: Home Health Living arrangements for the past 2 months: Single Family Home              DME Arranged: N/A DME Agency: NA  Prior Living Arrangements/Services Living arrangements for the past 2 months: Single Family Home Lives with:: Adult Children Patient language and need for interpreter reviewed:: Yes Do you feel safe going back to the place where you live?: Yes      Need for Family Participation in Patient Care: Yes (Comment) Care giver support system in place?: Yes (comment) Current home services: DME Dan Humphre[facility], [facility]) Criminal Activity/Legal Involvement Pertinent to Current Situation/Hospitalization: No - Comment as needed  Activities of Daily Living Home Assistive Devices/Equipment: None ADL Screening (condition at time of admission) Patient's  cognitive ability adequate to safely complete daily activities?: Yes Is the patient deaf or have difficulty hearing?: Yes (hearing aids) Does the patient have difficulty seeing, even when wearing glasses/contacts?: Yes Does the patient have difficulty concentrating, remembering, or making decisions?: No Patient able to express need for assistance with ADLs?: Yes Does the patient have difficulty dressing or bathing?: Yes Independently performs ADLs?: Yes (appropriate for developmental age) (difficulty r/t RA and contractures) Does the patient have difficulty walking or climbing stairs?: Yes Weakness of Legs: Both Weakness of Arms/Hands: Both  Permission Sought/Granted Permission sought to share information with : Other (comment) Permission granted to share information with : Yes, Verbal Permission Granted  Permission granted to share info w AGENCY: HH agencies  Emotional Assessment Appearance:: Appears stated age Attitude/Demeanor/Rapport: Engaged Affect (typically observed): Accepting Orientation: : Oriented to Self,Oriented to Place,Oriented to  Time,Oriented to Situation Alcohol / Substance Use: Not Applicable Psych Involvement: No (comment)  Admission diagnosis:  Closed fracture of right hip, initial encounter (HCC) [S72.001A] Scalp laceration, initial encounter [S01.01XA] Closed right hip fracture, initial encounter [facility]) [S72.001A] Patient Active Problem List   Diagnosis Date Noted  . Closed right hip fracture, initial encounter (HCC) 10/05/2020  . Fall from ground level 10/05/2020  . Essential hypertension 10/05/2020  . Atrial fibrillation, chronic (HCC) 10/05/2020  . Scalp hematoma, initial encounter 10/05/2020   PCP:  No primary care provider on file. Pharmacy:   [facility] [place], [place] - [phone] [place] [address] Phone: [phone] Fax: [phone]  Readmission Risk Interventions No flowsheet data found.

## 2020-10-07 ENCOUNTER — Encounter (HOSPITAL_COMMUNITY): Payer: Self-pay | Admitting: Orthopedic Surgery

## 2020-10-07 DIAGNOSIS — E43 Unspecified severe protein-calorie malnutrition: Secondary | ICD-10-CM | POA: Diagnosis present

## 2020-10-07 LAB — CBC
HCT: 35.1 % — ABNORMAL LOW (ref 36.0–46.0)
Hemoglobin: 10.7 g/dL — ABNORMAL LOW (ref 12.0–15.0)
MCH: 31.4 pg (ref 26.0–34.0)
MCHC: 30.5 g/dL (ref 30.0–36.0)
MCV: 102.9 fL — ABNORMAL HIGH (ref 80.0–100.0)
Platelets: 140 10*3/uL — ABNORMAL LOW (ref 150–400)
RBC: 3.41 MIL/uL — ABNORMAL LOW (ref 3.87–5.11)
RDW: 14.7 % (ref 11.5–15.5)
WBC: 7.1 10*3/uL (ref 4.0–10.5)
nRBC: 0 % (ref 0.0–0.2)

## 2020-10-07 LAB — RENAL FUNCTION PANEL
Albumin: 2.8 g/dL — ABNORMAL LOW (ref 3.5–5.0)
Anion gap: 12 (ref 5–15)
BUN: 14 mg/dL (ref 8–23)
CO2: 20 mmol/L — ABNORMAL LOW (ref 22–32)
Calcium: 7.9 mg/dL — ABNORMAL LOW (ref 8.9–10.3)
Chloride: 103 mmol/L (ref 98–111)
Creatinine, Ser: 0.7 mg/dL (ref 0.44–1.00)
GFR, Estimated: >60 mL/min (ref >60)
Glucose, Bld: 188 mg/dL — ABNORMAL HIGH (ref 70–99)
Phosphorus: 3.3 mg/dL (ref 2.5–4.6)
Potassium: 4.6 mmol/L (ref 3.5–5.1)
Sodium: 135 mmol/L (ref 135–145)

## 2020-10-07 LAB — MAGNESIUM: Magnesium: 2.1 mg/dL (ref 1.7–2.4)

## 2020-10-07 MED ORDER — ACETAMINOPHEN 325 MG PO TABS
650.0000 mg | ORAL_TABLET | Freq: Four times a day (QID) | ORAL | Status: DC | PRN
  Administered 2020-10-08: 650 mg via ORAL
  Filled 2020-10-07: qty 2

## 2020-10-07 MED ORDER — HYDROCODONE-ACETAMINOPHEN 5-325 MG PO TABS: 1.0000 | ORAL_TABLET | Freq: Four times a day (QID) | ORAL | Status: DC | PRN

## 2020-10-07 MED ORDER — SENNOSIDES-DOCUSATE SODIUM 8.6-50 MG PO TABS: 1.0000 | ORAL_TABLET | Freq: Two times a day (BID) | ORAL | Status: DC | PRN

## 2020-10-07 MED ORDER — ENSURE ENLIVE PO LIQD: 237.0000 mL | Freq: Two times a day (BID) | ORAL | Status: DC

## 2020-10-07 MED ORDER — METOPROLOL TARTRATE 50 MG PO TABS
100.0000 mg | ORAL_TABLET | Freq: Two times a day (BID) | ORAL | Status: DC
  Administered 2020-10-07 – 2020-10-08 (×2): 100 mg via ORAL
  Filled 2020-10-07 (×2): qty 2

## 2020-10-07 MED ORDER — POLYETHYLENE GLYCOL 3350 17 G PO PACK: 17.0000 g | PACK | Freq: Two times a day (BID) | ORAL | Status: DC | PRN

## 2020-10-07 NOTE — Progress Notes (Signed)
Initial Nutrition Assessment  DOCUMENTATION CODES:   Severe malnutrition in context of chronic illness,Underweight  INTERVENTION:  - will order Ensure Enlive BID, each supplement provides 350 kcal and 20 grams of protein.   NUTRITION DIAGNOSIS:   Severe Malnutrition related to chronic illness as evidenced by severe fat depletion,severe muscle depletion.  GOAL:   Patient will meet greater than or equal to 90% of their needs  MONITOR:   PO intake,Supplement acceptance,Labs,Weight trends,Skin  REASON FOR ASSESSMENT:   Consult Assessment of nutrition requirement/status  ASSESSMENT:   87 year old female with medical history of RA, CVA, afib, LBBB, and HTN. She presented to the ED after a fall with associated R femoral neck fx and scalp laceration at her daughter's house. No LOC. She underwent THA of right hip on 3/21.  She consumed 1005 of breakfast and lunch today (total of 1182 kcal and 30 grams protein).   Patient is in the [place] area to help care for her daughter who has progressive Alzheimer's and prepare for daughter to move to a facility. Her husband, who also had Alzheimer's, passed about 2 months ago.   Due to the stress and needs of her caregiver role, she has not been able to eat as much as she would like. She weighed ~108 lb two years ago and reports current weight of ~78 lb. This indicates 30 lb weight loss (28% body weight).   She reports being very hungry today and eating everything on her plate for both meals. She likes her food well seasoned but eats bland food at home because of there daughter's needs.  Patient does report that she has hx of IBS and this sometimes impacts what she eats and negatively impacts her desire to gain weight.  She drinks Ensure milk shakes at home and is open to receiving Ensure shakes during hospitalization.    Labs reviewed; Ca: 7.9 mg/dl. Medications reviewed; 100 mg colace BID, 325 mg ferrous sulfate TID.    NUTRITION -  FOCUSED PHYSICAL EXAM:  Flowsheet Row Most Recent Value  Orbital Region Moderate depletion  Upper Arm Region Severe depletion  Thoracic and Lumbar Region Severe depletion  Buccal Region Severe depletion  Temple Region Moderate depletion  Clavicle Bone Region Severe depletion  Clavicle and Acromion Bone Region Severe depletion  Scapular Bone Region Severe depletion  Dorsal Hand Severe depletion  Patellar Region Severe depletion  Anterior Thigh Region Severe depletion  Posterior Calf Region Severe depletion  Edema (RD Assessment) None  Hair Reviewed  Eyes Reviewed  Mouth Reviewed  Skin Reviewed  Nails Reviewed       Diet Order:   Diet Order            Diet Heart Room service appropriate? Yes; Fluid consistency: Thin  Diet effective now                 EDUCATION NEEDS:   No education needs have been identified at this time  Skin:  Skin Assessment: Skin Integrity Issues: Skin Integrity Issues:: Incisions Incisions: R hip (3/21)  Last BM:  3/19  Height:   Ht Readings from Last 1 Encounters:  10/05/20 5\' 1"  (1.549 m)    Weight:   Wt Readings from Last 1 Encounters:  10/05/20 34 kg    Ideal Body Weight:  47.72 kg  BMI:  Body mass index is 14.17 kg/m.  Estimated Nutritional Needs:   Kcal:  1350-1550 kcal  Protein:  68-80 grams  Fluid:  >/= 1.5 L/day  Jeree Delcid, MS, RD, LDN, CNSC Inpatient Clinical Dietitian RD pager # available in AMION  After hours/weekend pager # available in AMION  

## 2020-10-07 NOTE — TOC Progression Note (Signed)
Transition of Care [facility]) - Progression Note   Patient Details  Name: Kristen Lamb MRN: [id number] Date of Birth: 06-27-1930  Transition of Care [facility]) CM/SW Contact  Ewing Schlein, LCSW Phone Number: 10/07/2020, 1:27 PM  Clinical Narrative: CSW was notified by [facility] they do not have PT?OT services in patient's area [place]). CSW made referral to [facility] with [facility], which was accepted. Patient will receive PT and OT out of [facility]. Patient is agreeable and is aware of a delay to start date. TOC to follow.  Expected Discharge Plan: Home w Home Health Services Barriers to Discharge: Continued Medical Work up  Expected Discharge Plan and Services Expected Discharge Plan: Home w Home Health Services In-house Referral: Clinical Social Work Post Acute Care Choice: Home Health Living arrangements for the past 2 months: Single Family Home             DME Arranged: N/A DME Agency: NA HH Arranged: PT,OT HH Agency: [facility] Date HH Agency Contacted: 10/07/20 Time HH Agency Contacted: [phone] Representative spoke with at [facility] Agency: Kathlene November  Readmission Risk Interventions No flowsheet data found.

## 2020-10-07 NOTE — Progress Notes (Signed)
PROGRESS NOTE  Kristen Lamb YBO:[id number] DOB: 1930/05/04   PCP: No primary care provider on file.  Patient is from: Home.  Independent at baseline. Here from [place] area to take care of her daughter before fall.   DOA: 10/04/2020 LOS: 2  Chief complaints: Fall  Brief Narrative / Interim history: 87 year old F with PMH of RA, prior CVA, paroxysmal A. fib not on AC, LBBB and HTN came to ED after accidental fall and scalp laceration at her daughter's house.  No LOC.  She was found to have nondisplaced right femoral neck fracture.  She underwent THA of right hip on 10/06/2020 by Dr. Charlann Boxer.   Subjective: Seen and examined earlier this morning.  No major events overnight of this morning.  She is already up with therapy.  Pain is well controlled.  Sitting on bedside chair.  Denies chest pain, dyspnea or dizziness.  Patient son at bedside.  Objective: Vitals:   10/06/20 2159 10/07/20 0151 10/07/20 0551 10/07/20 1005  BP: 133/88 (!) 111/58 (!) 147/66 128/61  Pulse: 90 (!) 59 62 75  Resp: 16 16 16 16   Temp: 98.3 F (36.8 C) 98.3 F (36.8 C) 98.1 F (36.7 C) 98 F (36.7 C)  TempSrc: Oral Oral Oral Oral  SpO2: 96% 98% 100% 99%  Weight:      Height:        Intake/Output Summary (Last 24 hours) at 10/07/2020 1150 Last data filed at 10/07/2020 1019 Gross per 24 hour  Intake 1800 ml  Output 1400 ml  Net 400 ml   Filed Weights   10/05/20 1414  Weight: 34 kg    Examination: It is  GENERAL: No apparent distress.  Nontoxic. HEENT: MMM.  Vision and hearing grossly intact. Staples over right posterior scalp NECK: Supple.  No apparent JVD.  RESP: On RA.  No IWOB.  Fair aeration bilaterally. CVS:  RRR. Heart sounds normal.  ABD/GI/GU: BS+. Abd soft, NTND.  MSK/EXT:  Moves extremities. No apparent deformity. No edema.  Dressing over right thigh DCI. SKIN: Dressing over right thigh DCI. NEURO: Awake, alert and oriented appropriately.  No apparent focal neuro deficit. PSYCH:  Calm. Normal affect.   Procedures:    Microbiology summarized: COVID-19 and influenza PCR nonreactive.  Assessment & Plan: Accidental fall ground-level fall at home Scalp laceration/hematoma-repaired with 3 staples None displaced right femoral neck fracture-likely due to fall.  -S/p THA of right hip on 10/06/2020 by Dr. 10/08/2020.  -On as needed Robaxin and Norco for pain.  Added Tylenol as needed for mild pain -Bowel regimen's -PT/OT  Paroxysmal A. fib: In normal sinus rhythm -Increase metoprolol to 100 mg twice daily-Home dose  Chronic LBBB-no anginal symptoms. -Continue metoprolol as above  Essential hypertension: Normotensive. -Discontinue IV fluid -Continue home metoprolol -Discontinue amlodipine  Rheumatoid arthritis with significant deformities in her hands -Gets Orencia injection outpatient.   Underweight Body mass index is 14.17 kg/m.  -Consulted dietitian       DVT prophylaxis:  SCDs Start: 10/06/20 2003 Place TED hose Start: 10/06/20 2003 SCDs Start: 10/05/20 10/07/20  Code Status: Full code Family Communication: Updated patient's son at bedside.  Left voicemail for daughter. Level of care: Med-Surg Status is: Inpatient  Remains inpatient appropriate because:Inpatient level of care appropriate due to severity of illness   Dispo: The patient is from: Home              Anticipated d/c is to: Home with [facility] and DME likely on 3/23  Patient currently is not medically stable to d/c.   Difficult to place patient No       Consultants:  Orthopedic surgery, Dr. Charlann Boxer   Sch Meds:  Scheduled Meds: . amLODipine  5 mg Oral Daily  . aspirin  81 mg Oral BID  . atorvastatin  20 mg Oral QHS  . docusate sodium  100 mg Oral BID  . ferrous sulfate  325 mg Oral TID PC  . metoprolol tartrate  50 mg Oral BID  . mirtazapine  7.5 mg Oral QHS   Continuous Infusions: . lactated ringers 100 mL/hr at 10/07/20 0655  . methocarbamol (ROBAXIN) IV 500 mg  (10/06/20 1909)   PRN Meds:.bisacodyl, HYDROcodone-acetaminophen, menthol-cetylpyridinium **OR** phenol, methocarbamol **OR** methocarbamol (ROBAXIN) IV, metoCLOPramide **OR** metoCLOPramide (REGLAN) injection, ondansetron **OR** ondansetron (ZOFRAN) IV, polyethylene glycol  Antimicrobials: Anti-infectives (From admission, onward)   Start     Dose/Rate Route Frequency Ordered Stop   10/07/20 0000  ceFAZolin (ANCEF) IVPB 2g/100 mL premix        2 g 200 mL/hr over 30 Minutes Intravenous  Once 10/06/20 1831 10/07/20 0048   10/06/20 0845  ceFAZolin (ANCEF) IVPB 2g/100 mL premix        2 g 200 mL/hr over 30 Minutes Intravenous On call to O.R. 10/06/20 0756 10/06/20 1703       I have personally reviewed the following labs and images: CBC: Recent Labs  Lab 10/05/20 0022 10/06/20 0241 10/07/20 0249  WBC 7.4 5.9 7.1  NEUTROABS 4.8  --   --   HGB 11.6* 11.5* 10.7*  HCT 36.1 36.6 35.1*  MCV 97.8 97.6 102.9*  PLT 175 176 140*   BMP &GFR Recent Labs  Lab 10/05/20 0022 10/06/20 0241 10/07/20 0249  NA 137 139 135  K 4.5 4.1 4.6  CL 100 104 103  CO2 26 28 20*  GLUCOSE 122* 97 188*  BUN 29* 16 14  CREATININE 0.84 0.62 0.70  CALCIUM 8.4* 8.4* 7.9*  MG  --  2.2 2.1  PHOS  --  3.1 3.3   Estimated Creatinine Clearance: 25.6 mL/min (by C-G formula based on SCr of 0.7 mg/dL). Liver & Pancreas: Recent Labs  Lab 10/07/20 0249  ALBUMIN 2.8*   No results for input(s): LIPASE, AMYLASE in the last 168 hours. No results for input(s): AMMONIA in the last 168 hours. Diabetic: No results for input(s): HGBA1C in the last 72 hours. No results for input(s): GLUCAP in the last 168 hours. Cardiac Enzymes: No results for input(s): CKTOTAL, CKMB, CKMBINDEX, TROPONINI in the last 168 hours. No results for input(s): PROBNP in the last 8760 hours. Coagulation Profile: Recent Labs  Lab 10/05/20 0022  INR 1.1   Thyroid Function Tests: Recent Labs    10/06/20 0241  TSH 2.469   Lipid  Profile: No results for input(s): CHOL, HDL, LDLCALC, TRIG, CHOLHDL, LDLDIRECT in the last 72 hours. Anemia Panel: No results for input(s): VITAMINB12, FOLATE, FERRITIN, TIBC, IRON, RETICCTPCT in the last 72 hours. Urine analysis: No results found for: COLORURINE, APPEARANCEUR, LABSPEC, PHURINE, GLUCOSEU, HGBUR, BILIRUBINUR, KETONESUR, PROTEINUR, UROBILINOGEN, NITRITE, LEUKOCYTESUR Sepsis Labs: Invalid input(s): PROCALCITONIN, LACTICIDVEN  Microbiology: Recent Results (from the past 240 hour(s))  Resp Panel by RT-PCR (Flu A&B, Covid) Nasopharyngeal Swab     Status: None   Collection Time: 10/05/20  1:45 AM   Specimen: Nasopharyngeal Swab; Nasopharyngeal(NP) swabs in vial transport medium  Result Value Ref Range Status   SARS Coronavirus 2 by RT PCR NEGATIVE NEGATIVE Final  Comment: (NOTE) SARS-CoV-2 target nucleic acids are NOT DETECTED.  The SARS-CoV-2 RNA is generally detectable in upper respiratory specimens during the acute phase of infection. The lowest concentration of SARS-CoV-2 viral copies this assay can detect is 138 copies/mL. A negative result does not preclude SARS-Cov-2 infection and should not be used as the sole basis for treatment or other patient management decisions. A negative result may occur with  improper specimen collection/handling, submission of specimen other than nasopharyngeal swab, presence of viral mutation(s) within the areas targeted by this assay, and inadequate number of viral copies(<138 copies/mL). A negative result must be combined with clinical observations, patient history, and epidemiological information. The expected result is Negative.  Fact Sheet for Patients:  BloggerCourse.com  Fact Sheet for Healthcare Providers:  SeriousBroker.it  This test is no t yet approved or cleared by the [place] FDA and  has been authorized for detection and/or diagnosis of SARS-CoV-2 by FDA under  an Emergency Use Authorization (EUA). This EUA will remain  in effect (meaning this test can be used) for the duration of the COVID-19 declaration under Section 564(b)(1) of the Act, 21 U.S.C.section 360bbb-3(b)(1), unless the authorization is terminated  or revoked sooner.       Influenza A by PCR NEGATIVE NEGATIVE Final   Influenza B by PCR NEGATIVE NEGATIVE Final    Comment: (NOTE) The Xpert Xpress SARS-CoV-2/FLU/RSV plus assay is intended as an aid in the diagnosis of influenza from Nasopharyngeal swab specimens and should not be used as a sole basis for treatment. Nasal washings and aspirates are unacceptable for Xpert Xpress SARS-CoV-2/FLU/RSV testing.  Fact Sheet for Patients: BloggerCourse.com  Fact Sheet for Healthcare Providers: SeriousBroker.it  This test is not yet approved or cleared by the [place] FDA and has been authorized for detection and/or diagnosis of SARS-CoV-2 by FDA under an Emergency Use Authorization (EUA). This EUA will remain in effect (meaning this test can be used) for the duration of the COVID-19 declaration under Section 564(b)(1) of the Act, 21 U.S.C. section 360bbb-3(b)(1), unless the authorization is terminated or revoked.  Performed at [facility], [address], [place], [place] [address]     Radiology Studies: DG C-Arm 1-60 Min-No Report  Result Date: 10/06/2020 Fluoroscopy was utilized by the requesting physician.  No radiographic interpretation.   DG HIP OPERATIVE UNILAT W OR W/O PELVIS RIGHT  Result Date: 10/06/2020 CLINICAL DATA:  Right total hip replacement EXAM: OPERATIVE RIGHT HIP (WITH PELVIS IF PERFORMED) 2 VIEWS TECHNIQUE: Fluoroscopic spot image(s) were submitted for interpretation post-operatively. COMPARISON:  10/04/2020 FINDINGS: Intraoperative spot images demonstrate changes of right hip replacement. Normal AP alignment. No visible complicating  feature. IMPRESSION: Right hip replacement.  No visible complicating feature. Electronically Signed   By: Charlett Nose M.D.   On: 10/06/2020 20:10      Taye T[facility]  If 7PM-7AM, please contact night-coverage [email or web address] 10/07/2020, 11:50 AM

## 2020-10-07 NOTE — Progress Notes (Signed)
Patient ID: Kristen Lamb, female   DOB: Aug 06, 1929, 87 y.o.   MRN: [id number] Subjective: 1 Day Post-Op Procedure(s) (LRB): Right Femural Neck Fracture (Right)    Patient reports pain as mild.  She did report a lot of pain during transfer from PACU to floor but it subsided fairly quickly. Notes soreness to right thigh with movement  Objective:   VITALS:   Vitals:   10/07/20 0151 10/07/20 0551  BP: (!) 111/58 (!) 147/66  Pulse: (!) 59 62  Resp: 16 16  Temp: 98.3 F (36.8 C) 98.1 F (36.7 C)  SpO2: 98% 100%    Neurovascular intact Incision: dressing C/D/I  LABS Recent Labs    10/05/20 0022 10/06/20 0241 10/07/20 0249  HGB 11.6* 11.5* 10.7*  HCT 36.1 36.6 35.1*  WBC 7.4 5.9 7.1  PLT 175 176 140*    Recent Labs    10/05/20 0022 10/06/20 0241 10/07/20 0249  NA 137 139 135  K 4.5 4.1 4.6  BUN 29* 16 14  CREATININE 0.84 0.62 0.70  GLUCOSE 122* 97 188*    Recent Labs    10/05/20 0022  INR 1.1     Assessment/Plan: 1 Day Post-Op Procedure(s) (LRB): Right Femural Neck Fracture (Right)   Advance diet Up with therapy - WBAT  Plan is to work on mobilization with goal of home discharge Wednesday afternoon or Thursday if doing well. Will arrange follow up plans with her prior to discharge ASA/SCDs for DVT prophylaxis

## 2020-10-07 NOTE — Evaluation (Signed)
Physical Therapy Evaluation Patient Details Name: Kristen Lamb MRN: [id number] DOB: 1929-08-16 Today's Date: 10/07/2020   History of Present Illness  87 y.o. female presenting s/p mechanical fall. Imaging (+) nondispalced femoral neck fracture. Patient s/p R total hip replacement-direct anterior. PMHx significant for RA, PAF, A-fib, CVA, LBBB and HTN.  Clinical Impression  On eval, pt required Min assist for mobility. She walked ~50 feet with a RW. Moderate pain with activity.Will follow and progress activity as tolerated. Discussed d/c plan with pt and family-plan is for home with family assisting as needed.     Follow Up Recommendations Home health PT;Supervision/Assistance - 24 hour    Equipment Recommendations  None recommended by PT    Recommendations for Other Services       Precautions / Restrictions Precautions Precautions: Fall Precaution Booklet Issued: No Restrictions Weight Bearing Restrictions: No RLE Weight Bearing: Weight bearing as tolerated      Mobility  Bed Mobility Overal bed mobility: Needs Assistance Bed Mobility: Supine to Sit     Supine to sit: Min guard;HOB elevated     General bed mobility comments: oob in recliner    Transfers Overall transfer level: Needs assistance Equipment used: Rolling walker (2 wheeled) Transfers: Sit to/from Stand Sit to Stand: Min assist         General transfer comment: Cues for safety, technique, hand placement. Assist to power up, stabilize, control descent.  Ambulation/Gait Ambulation/Gait assistance: Min assist Gait Distance (Feet): 50 Feet Assistive device: Rolling walker (2 wheeled) Gait Pattern/deviations: Step-to pattern;Step-through pattern;Decreased stride length     General Gait Details: Progressed to step through pattern (not fully) as distance increased. Cues for safety, technique, sequence. Distance limited by pain, fatigue.  Stairs            Wheelchair Mobility    Modified  Rankin (Stroke Patients Only)       Balance Overall balance assessment: Needs assistance;History of Falls Sitting-balance support: Single extremity supported;Feet supported Sitting balance-Leahy Scale: Fair Sitting balance - Comments: Able to maintain static sitting balance at EOB without external assist.   Standing balance support: Bilateral upper extremity supported Standing balance-Leahy Scale: Poor Standing balance comment: Reliant on BUE on RW.                             Pertinent Vitals/Pain Pain Assessment: Faces Faces Pain Scale: Hurts even more Pain Location: RLE with movement/mobility Pain Descriptors / Indicators: Aching;Sore;Grimacing;Guarding Pain Intervention(s): Limited activity within patient's tolerance;Monitored during session;Repositioned    Home Living Family/patient expects to be discharged to:: Private residence Living Arrangements: Alone Available Help at Discharge: Family;Available 24 hours/day Type of Home: House Home Access: Ramped entrance     Home Layout: One level Home Equipment: Walker - 2 wheels;Walker - 4 wheels;Bedside commode Additional Comments: Family reports ability to rent a wc in town    Prior Function Level of Independence: Independent with assistive device(s)   Gait / Transfers Assistance Needed: Rollator for home and community mobility  ADL's / Homemaking Assistance Needed: Mod I with ADLs/IADLs with use of AD.        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 Patient able to feed herself without difficulty. RUE Coordination: decreased fine motor LUE Deficits / Details: Swan neck deformity, haberdenes and  bouchard's nodes 2/2 RA bilaterally. Patient able to feed herself without difficulty. LUE Coordination:  decreased fine motor    Lower Extremity Assessment Lower Extremity Assessment: Generalized weakness (post op surgical weakness)    Cervical / Trunk Assessment Cervical / Trunk Assessment: Kyphotic  Communication   Communication: HOH  Cognition Arousal/Alertness: Awake/alert Behavior During Therapy: WFL for tasks assessed/performed Overall Cognitive Status: Within Functional Limits for tasks assessed                                        General Comments General comments (skin integrity, edema, etc.): Son present at bedside at conclusion of evaluaiton. Family very supportive.    Exercises     Assessment/Plan    PT Assessment Patient needs continued PT services  PT Problem List Decreased strength;Decreased range of motion;Decreased mobility;Decreased activity tolerance;Decreased balance;Decreased knowledge of use of DME;Pain       PT Treatment Interventions DME instruction;Gait training;Therapeutic exercise;Balance training;Functional mobility training;Therapeutic activities;Patient/family education    PT Goals (Current goals can be found in the Care Plan section)  Acute Rehab PT Goals Patient Stated Goal: To return home. PT Goal Formulation: With patient/family Time For Goal Achievement: 10/21/20 Potential to Achieve Goals: Good    Frequency Min 5X/week   Barriers to discharge        Co-evaluation               AM-PAC PT "6 Clicks" Mobility  Outcome Measure Help needed turning from your back to your side while in a flat bed without using bedrails?: A Little Help needed moving from lying on your back to sitting on the side of a flat bed without using bedrails?: A Little Help needed moving to and from a bed to a chair (including a wheelchair)?: A Little Help needed standing up from a chair using your arms (e.g., wheelchair or bedside chair)?: A Little Help needed to walk in hospital room?: A Little Help needed climbing 3-5 steps with a  railing? : A Lot 6 Click Score: 17    End of Session Equipment Utilized During Treatment: Gait belt Activity Tolerance: Patient limited by fatigue;Patient limited by pain Patient left: in chair;with call bell/phone within reach;with family/visitor present;with chair alarm set   PT Visit Diagnosis: History of falling (Z91.81);Muscle weakness (generalized) (M62.81);Difficulty in walking, not elsewhere classified (R26.2);Pain Pain - Right/Left: Right Pain - part of body: Hip    Time: 0272-5366 PT Time Calculation (min) (ACUTE ONLY): 19 min   Charges:   PT Evaluation $PT Eval Moderate Complexity: 1 Mod            Faye Ramsay, PT Acute Rehabilitation  Office: [phone] Pager: [phone]

## 2020-10-07 NOTE — Evaluation (Signed)
Occupational Therapy Evaluation Patient Details Name: Kristen Lamb MRN: [id number] DOB: November 11, 1929 Today's Date: 10/07/2020    History of Present Illness 87 y.o. female presenting s/p mechanical fall. Imaging (+) nondispalced femoral neck fracture. Patient s/p R total hip replacement through an anterior approach. TDWB RLE. PMHx significant for RA, PAF, A-fib, CVA, LBBB and HTN.   Clinical Impression   PTA patient was living alone in a private residence in [place] and was grossly Mod I with ADLs with use of rollator. Patient reports driving, grocery shopping and cooking for herself. Patient recently in [place] to care for her daughter who has new onset memory deficits. Patient currently functioning below baseline requiring Min guard to Min A for bed mobility and Mod A grossly for LB ADLs. Patient also limited by pain in R hip with movement/mobility and balance/gait deficits. Patient would benefit from continued acute OT services to maximize safety and independence with self-care tasks and decrease risk of falls.     Follow Up Recommendations  Home health OT;Supervision/Assistance - 24 hour    Equipment Recommendations  Wheelchair (measurements OT);Wheelchair cushion (measurements OT)    Recommendations for Other Services       Precautions / Restrictions Precautions Precautions: Anterior Hip;Fall Precaution Booklet Issued: No Restrictions Weight Bearing Restrictions: Yes RLE Weight Bearing: Weight bearing as tolerated      Mobility Bed Mobility Overal bed mobility: Needs Assistance Bed Mobility: Supine to Sit     Supine to sit: Min guard;HOB elevated     General bed mobility comments: Min guard at RLE. Patient able to complete anterior scoots toward EOB with supervision A for safety.    Transfers Overall transfer level: Needs assistance Equipment used: Rolling walker (2 wheeled) Transfers: Sit to/from Stand Sit to Stand: Min assist         General transfer  comment: Min A for stand-pivot to recliner with RW. Patient with wide BOS and gait deficits. Cues for hand placement and walker management.    Balance Overall balance assessment: History of Falls;Needs assistance Sitting-balance support: Single extremity supported;Feet supported Sitting balance-Leahy Scale: Fair Sitting balance - Comments: Able to maintain static sitting balance at EOB without external assist.   Standing balance support: Bilateral upper extremity supported;During functional activity Standing balance-Leahy Scale: Poor Standing balance comment: Reliant on BUE on RW.                           ADL either performed or assessed with clinical judgement   ADL Overall ADL's : Needs assistance/impaired Eating/Feeding: Set up;Sitting   Grooming: Set up;Sitting Grooming Details (indicate cue type and reason): Able to wash face seated in recliner with set-up assist.             Lower Body Dressing: Moderate assistance;Sit to/from stand Lower Body Dressing Details (indicate cue type and reason): Mod A to don footwear seated EOB. Toilet Transfer: Minimal Production designer, theatre/television/film Details (indicate cue type and reason): Simulated with transfer to recliner and use of RW. Patient able to hold onto walker without difficulty despite deformity in hand bilaterally.         Functional mobility during ADLs: Minimal assistance;Rolling walker General ADL Comments: Patient limited by pain in R hip and decreased standing balance requiring Min to Mod A grossly for ADLs.     Vision Baseline Vision/History: Wears glasses Wears Glasses: At all times Patient Visual Report: No change from baseline       Perception  Praxis      Pertinent Vitals/Pain Pain Assessment: Faces Faces Pain Scale: Hurts even more Pain Location: RLE with movement/mobility Pain Descriptors / Indicators: Aching;Sore;Grimacing;Guarding Pain Intervention(s): Limited activity within patient's  tolerance;Monitored during session;Repositioned;Premedicated before session     Hand Dominance Right   Extremity/Trunk Assessment Upper Extremity Assessment Upper Extremity Assessment: Generalized weakness;RUE deficits/detail;LUE deficits/detail RUE Deficits / Details: Swan neck deformity, 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 Patient able to feed herself without difficulty. 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 Patient able to feed herself without difficulty. LUE Coordination: decreased fine motor   Lower Extremity Assessment Lower Extremity Assessment: Defer to PT evaluation   Cervical / Trunk Assessment Cervical / Trunk Assessment: Kyphotic   Communication Communication Communication: HOH   Cognition Arousal/Alertness: Awake/alert Behavior During Therapy: WFL for tasks assessed/performed Overall Cognitive Status: Within Functional Limits for tasks assessed                                     General Comments  Son present at bedside at conclusion of evaluaiton. Family very supportive.    Exercises     Shoulder Instructions      Home Living Family/patient expects to be discharged to:: Private residence Living Arrangements: Alone Available Help at Discharge: Family;Available 24 hours/day Type of Home: House Home Access: Ramped entrance     Home Layout: One level     Bathroom Shower/Tub: Producer, television/film/video: Standard Bathroom Accessibility: Yes How Accessible: Accessible via walker Home Equipment: Walker - 2 wheels;Walker - 4 wheels;Bedside commode   Additional Comments: Family reports ability to rent a wc in town      Prior Functioning/Environment Level of Independence: Independent with assistive device(s)  Gait / Transfers Assistance Needed: Rollator for home and community mobility ADL's / Homemaking Assistance Needed:  Mod I with ADLs/IADLs with use of AD.            OT Problem List: Decreased strength;Decreased activity tolerance;Impaired balance (sitting and/or standing);Decreased safety awareness;Decreased knowledge of precautions;Pain      OT Treatment/Interventions: Self-care/ADL training;Therapeutic exercise;Energy conservation;DME and/or AE instruction;Therapeutic activities;Patient/family education;Balance training    OT Goals(Current goals can be found in the care plan section) Acute Rehab OT Goals Patient Stated Goal: To return home. OT Goal Formulation: With patient Time For Goal Achievement: 10/21/20 Potential to Achieve Goals: Good ADL Goals Pt Will Perform Upper Body Dressing: with set-up;sitting Pt Will Perform Lower Body Dressing: with min assist;sit to/from stand Pt Will Transfer to Toilet: with supervision;ambulating;bedside commode Pt Will Perform Toileting - Clothing Manipulation and hygiene: sit to/from stand;with supervision Additional ADL Goal #1: Patient/family will recall 3 fall reducing strategies in prep for safe d/c home.  OT Frequency: Min 2X/week   Barriers to D/C:            Co-evaluation              AM-PAC OT "6 Clicks" Daily Activity     Outcome Measure Help from another person eating meals?: A Little Help from another person taking care of personal grooming?: A Little Help from another person toileting, which includes using toliet, bedpan, or urinal?: A Little Help from another person bathing (including washing, rinsing, drying)?: A Lot Help from another person to put on and taking off regular upper body clothing?: A Little Help from another person  to put on and taking off regular lower body clothing?: A Lot 6 Click Score: 16   End of Session Equipment Utilized During Treatment: Gait belt;Rolling walker Nurse Communication: Mobility status  Activity Tolerance: Patient tolerated treatment well Patient left: in chair;with call bell/phone within  reach;with chair alarm set;with family/visitor present  OT Visit Diagnosis: Unsteadiness on feet (R26.81);Other abnormalities of gait and mobility (R26.89);Muscle weakness (generalized) (M62.81);History of falling (Z91.81);Pain Pain - Right/Left: Right Pain - part of body: Hip;Leg                Time: 1610-9604 OT Time Calculation (min): 25 min Charges:  OT General Charges $OT Visit: 1 Visit OT Evaluation $OT Eval Moderate Complexity: 1 Mod OT Treatments $Therapeutic Activity: 8-22 mins  Maeryn Mcgath H. OTR/L Supplemental OT, Department of rehab services [phone]   Veleda Mun R H. 10/07/2020, 8:42 AM

## 2020-10-07 NOTE — Plan of Care (Signed)
  Problem: Health Behavior/Discharge Planning: Goal: Ability to manage health-related needs will improve Outcome: Progressing   Problem: Pain Managment: Goal: General experience of comfort will improve Outcome: Progressing   Problem: Safety: Goal: Ability to remain free from injury will improve Outcome: Progressing   Problem: Skin Integrity: Goal: Risk for impaired skin integrity will decrease Outcome: Progressing   

## 2020-10-08 DIAGNOSIS — E43 Unspecified severe protein-calorie malnutrition: Secondary | ICD-10-CM

## 2020-10-08 DIAGNOSIS — Z96641 Presence of right artificial hip joint: Secondary | ICD-10-CM

## 2020-10-08 LAB — CBC
HCT: 31.2 % — ABNORMAL LOW (ref 36.0–46.0)
Hemoglobin: 10.2 g/dL — ABNORMAL LOW (ref 12.0–15.0)
MCH: 31.2 pg (ref 26.0–34.0)
MCHC: 32.7 g/dL (ref 30.0–36.0)
MCV: 95.4 fL (ref 80.0–100.0)
Platelets: 133 10*3/uL — ABNORMAL LOW (ref 150–400)
RBC: 3.27 MIL/uL — ABNORMAL LOW (ref 3.87–5.11)
RDW: 14.5 % (ref 11.5–15.5)
WBC: 9.6 10*3/uL (ref 4.0–10.5)
nRBC: 0 % (ref 0.0–0.2)

## 2020-10-08 LAB — FERRITIN: Ferritin: 191 ng/mL (ref 11–307)

## 2020-10-08 LAB — RENAL FUNCTION PANEL
Albumin: 2.8 g/dL — ABNORMAL LOW (ref 3.5–5.0)
Anion gap: 6 (ref 5–15)
BUN: 14 mg/dL (ref 8–23)
CO2: 26 mmol/L (ref 22–32)
Calcium: 8.4 mg/dL — ABNORMAL LOW (ref 8.9–10.3)
Chloride: 106 mmol/L (ref 98–111)
Creatinine, Ser: 0.61 mg/dL (ref 0.44–1.00)
GFR, Estimated: >60 mL/min (ref >60)
Glucose, Bld: 146 mg/dL — ABNORMAL HIGH (ref 70–99)
Phosphorus: 2.3 mg/dL — ABNORMAL LOW (ref 2.5–4.6)
Potassium: 4.3 mmol/L (ref 3.5–5.1)
Sodium: 138 mmol/L (ref 135–145)

## 2020-10-08 LAB — RETICULOCYTES
Immature Retic Fract: 10.4 % (ref 2.3–15.9)
RBC.: 3.25 MIL/uL — ABNORMAL LOW (ref 3.87–5.11)
Retic Count, Absolute: 27.8 10*3/uL (ref 19.0–186.0)
Retic Ct Pct: 0.8 % (ref 0.4–3.1)

## 2020-10-08 LAB — VITAMIN B12: Vitamin B-12: 142 pg/mL — ABNORMAL LOW (ref 180–914)

## 2020-10-08 LAB — MAGNESIUM: Magnesium: 1.9 mg/dL (ref 1.7–2.4)

## 2020-10-08 LAB — IRON AND TIBC
Iron: 27 ug/dL — ABNORMAL LOW (ref 28–170)
Saturation Ratios: 11 % (ref 10.4–31.8)
TIBC: 246 ug/dL — ABNORMAL LOW (ref 250–450)
UIBC: 219 ug/dL

## 2020-10-08 LAB — FOLATE: Folate: 11.1 ng/mL (ref >5.9)

## 2020-10-08 MED ORDER — ACETAMINOPHEN 325 MG PO TABS: 650.0000 mg | ORAL_TABLET | Freq: Four times a day (QID) | ORAL | 0 refills | Status: AC | PRN

## 2020-10-08 MED ORDER — METHOCARBAMOL 500 MG PO TABS: 500.0000 mg | ORAL_TABLET | Freq: Four times a day (QID) | ORAL | 0 refills | Status: AC | PRN

## 2020-10-08 MED ORDER — ASPIRIN 81 MG PO CHEW: 81.0000 mg | CHEWABLE_TABLET | Freq: Two times a day (BID) | ORAL | 0 refills | Status: AC

## 2020-10-08 MED ORDER — SENNOSIDES-DOCUSATE SODIUM 8.6-50 MG PO TABS: 1.0000 | ORAL_TABLET | Freq: Two times a day (BID) | ORAL | 0 refills | Status: AC | PRN

## 2020-10-08 MED ORDER — FERROUS SULFATE 325 (65 FE) MG PO TABS: 325.0000 mg | ORAL_TABLET | Freq: Three times a day (TID) | ORAL | 2 refills | Status: AC

## 2020-10-08 MED ORDER — HYDROCODONE-ACETAMINOPHEN 5-325 MG PO TABS
1.0000 | ORAL_TABLET | Freq: Three times a day (TID) | ORAL | 0 refills | Status: AC | PRN
Start: 2020-10-08 — End: ?

## 2020-10-08 NOTE — Progress Notes (Signed)
Physical Therapy Treatment Patient Details Name: Kristen Lamb MRN: [id number] DOB: 1929/09/21 Today's Date: 10/08/2020    History of Present Illness 87 y.o. female presenting s/p mechanical fall. Imaging (+) nondisplaced femoral neck fracture. Patient s/p R total hip replacement-direct anterior. PMHx significant for RA, PAF, A-fib, CVA, LBBB and HTN.    PT Comments    Progressing with mobility. All education completed. Okay to d/c from PT standpoint.    Follow Up Recommendations  Home health PT;Supervision/Assistance - 24 hour     Equipment Recommendations  None recommended by PT    Recommendations for Other Services       Precautions / Restrictions Precautions Precautions: Fall Restrictions Weight Bearing Restrictions: No RLE Weight Bearing: Weight bearing as tolerated    Mobility  Bed Mobility Overal bed mobility: Needs Assistance Bed Mobility: Supine to Sit     Supine to sit: Min guard;HOB elevated     General bed mobility comments: cues for safety. increased time.    Transfers Overall transfer level: Needs assistance Equipment used: Rolling walker (2 wheeled) Transfers: Sit to/from Stand Sit to Stand: Min assist         General transfer comment: Cues for safety, technique, hand placement. Assist to power up, stabilize, control descent.  Ambulation/Gait Ambulation/Gait assistance: Min assist Gait Distance (Feet): 75 Feet Assistive device: Rolling walker (2 wheeled) Gait Pattern/deviations: Step-to pattern;Step-through pattern;Decreased stride length     General Gait Details: Progressed to step through pattern (not fully) as distance increased. Cues for safety, technique, sequence. Distance limited by pain, fatigue.   Stairs             Wheelchair Mobility    Modified Rankin (Stroke Patients Only)       Balance Overall balance assessment: Needs assistance;History of Falls         Standing balance support: Bilateral upper  extremity supported Standing balance-Leahy Scale: Poor                              Cognition Arousal/Alertness: Awake/alert Behavior During Therapy: WFL for tasks assessed/performed Overall Cognitive Status: Within Functional Limits for tasks assessed                                        Exercises General Exercises - Lower Extremity Ankle Circles/Pumps: AROM;Both;10 reps;Supine Quad Sets: AROM;Both;10 reps;Supine Heel Slides: AAROM;Right;10 reps;Supine;AROM Hip ABduction/ADduction: AAROM;AROM;Right;10 reps;Supine    General Comments        Pertinent Vitals/Pain Pain Assessment: Faces Faces Pain Scale: Hurts even more Pain Location: RLE with movement/mobility Pain Descriptors / Indicators: Aching;Sore;Grimacing;Guarding Pain Intervention(s): Monitored during session;Repositioned    Home Living                      Prior Function            PT Goals (current goals can now be found in the care plan section) Progress towards PT goals: Progressing toward goals    Frequency    Min 5X/week      PT Plan Current plan remains appropriate    Co-evaluation              AM-PAC PT "6 Clicks" Mobility   Outcome Measure  Help needed turning from your back to your side while in a flat bed without using bedrails?: A Little Help needed moving  from lying on your back to sitting on the side of a flat bed without using bedrails?: A Little Help needed moving to and from a bed to a chair (including a wheelchair)?: A Little Help needed standing up from a chair using your arms (e.g., wheelchair or bedside chair)?: A Little Help needed to walk in hospital room?: A Little Help needed climbing 3-5 steps with a railing? : A Lot 6 Click Score: 17    End of Session Equipment Utilized During Treatment: Gait belt Activity Tolerance: Patient limited by fatigue;Patient limited by pain Patient left: in bed;with call bell/phone within reach;with  family/visitor present   PT Visit Diagnosis: History of falling (Z91.81);Muscle weakness (generalized) (M62.81);Difficulty in walking, not elsewhere classified (R26.2);Pain Pain - Right/Left: Right Pain - part of body: Hip     Time: 3967-2897 PT Time Calculation (min) (ACUTE ONLY): 18 min  Charges:  $Gait Training: 8-22 mins                         Faye Ramsay, PT Acute Rehabilitation  Office: [phone] Pager: [phone]

## 2020-10-08 NOTE — Discharge Summary (Signed)
Physician Discharge Summary  Kristen Lamb ZOX:[id number] DOB: [birth year] DOA: 10/04/2020  PCP: No primary care provider on file.  Admit date: 10/04/2020 Discharge date: 10/08/2020  Admitted From: Home  Discharge disposition: Home with [facility]  Recommendations for Outpatient Follow-Up:   . Follow up with your primary care provider in one week.  . Check CBC, BMP, magnesium in the next visit . Follow-up with orthopedic surgery Dr. Charlann Boxer in 2 weeks. . Consider removal of the staples from the scalp in 1 week.  Discharge Diagnosis:   Active Problems:   Closed right hip fracture, initial encounter (HCC)   Fall from ground level   Essential hypertension   Atrial fibrillation, chronic (HCC)   Scalp hematoma, initial encounter   S/P total right hip arthroplasty   Protein-calorie malnutrition, severe  Discharge Condition: Improved.  Diet recommendation:   Regular.  Wound care: None.  Code status: Full.   History of Present Illness:   87 year old female with past medical history of rheumatoid arthritis,, prior CVA, paroxysmal A. fib not on AC, LBBB and HTN came to ED after accidental fall and scalp laceration at her daughter's house.    There was no report of loss of consciousness.Marland Kitchen  She was found to have nondisplaced right femoral neck fracture.  She underwent total hip arthroplasty of right hip on 10/06/2020 by Dr. Charlann Boxer.  Postoperative course was unremarkable.  Hospital Course:   Following conditions were addressed during hospitalization as listed below,  Accidental fall ground-level fall at home Scalp laceration/hematoma-repaired with staples. None displaced right femoral neck fracture-likely due to fall.  -S/p THA of right hip on 10/06/2020 by Dr. Charlann Boxer.  Continue Robaxin, Norco, Tylenol as needed for pain.  Continue PT OT at home.  On aspirin twice daily low-dose for DVT prophylaxis.  Vitamin D level was 33.  Mild anemia.  Iron profile with low iron.  We will continue iron  supplementation.  Paroxysmal A. Fib: Resume home metoprolol dose aspirin  Chronic LBBB- Asymptomatic.  Continue metoprolol.    Essential hypertension:  On metoprolol at home.  Rheumatoid arthritis with significant deformities in her hands -Gets Orencia injection outpatient.  Disposition.  At this time, patient is stable for disposition with home health.  I was unable to reach the patient daughter on the phone today.  Medical Consultants:    Orthopedics  Procedures:    Total hip arthroplasty on the right on 10/06/2020 Subjective:   Today, patient was seen and examined at bedside.  Denies any nausea, vomiting fever chills,  has ambulated.  Denies overt pain.  Discharge Exam:   Vitals:   10/07/20 2137 10/08/20 0530  BP: (!) 129/57 139/79  Pulse: 74 78  Resp: 16 16  Temp: 97.6 F (36.4 C) 98 F (36.7 C)  SpO2: 99% 99%   Vitals:   10/07/20 1342 10/07/20 1747 10/07/20 2137 10/08/20 0530  BP: (!) 128/46 (!) 135/57 (!) 129/57 139/79  Pulse: 78 84 74 78  Resp: Temp: 98.2 F (36.8 C) 98.2 F (36.8 C) 97.6 F (36.4 C) 98 F (36.7 C)  TempSrc: Oral Oral    SpO2: 99% 99% 99% 99%  Weight:      Height:       Body mass index is 14.17 kg/m.   General: Alert awake, not in obvious distress, thinly built HENT: pupils equally reacting to light,  No scleral pallor or icterus noted. Oral mucosa is moist.  Scalp laceration with staples. Chest:  Clear breath sounds.  Diminished breath sounds bilaterally. No crackles or wheezes.  CVS: S1 &S2 heard. No murmur.  Regular rate and rhythm. Abdomen: Soft, nontender, nondistended.  Bowel sounds are heard.   Extremities: No cyanosis, clubbing or edema.  Peripheral pulses are palpable.  Right hip surgical site with dressing. Psych: Alert, awake and communicative, normal mood CNS:  No cranial nerve deficits.  Power equal in all extremities.   Skin: Warm and dry.  Hip surgical site healthy  The results of significant  diagnostics from this hospitalization (including imaging, microbiology, ancillary and laboratory) are listed below for reference.     Diagnostic Studies:   DG Chest 1 View  Result Date: 10/04/2020 CLINICAL DATA:  Fall EXAM: CHEST  1 VIEW COMPARISON:  None. FINDINGS: There is hyperinflation of the lungs compatible with COPD. Cardiomegaly. Calcified granulomas in the lungs. No confluent opacity or effusion. No acute bony abnormality. Biapical scarring. IMPRESSION: COPD/chronic changes. Cardiomegaly. No active disease. Electronically Signed   By: Charlett Nose M.D.   On: 10/04/2020 23:51   DG Pelvis 1-2 Views  Result Date: 10/04/2020 CLINICAL DATA:  Fall, right hip pain EXAM: PELVIS - 1-2 VIEW COMPARISON:  None. FINDINGS: Right femoral neck fracture noted. No subluxation or dislocation. Mild degenerative changes in the hips bilaterally. SI joints symmetric and unremarkable. IMPRESSION: Right femoral neck fracture. Electronically Signed   By: Charlett Nose M.D.   On: 10/04/2020 23:52   CT Head Wo Contrast  Result Date: 10/05/2020 CLINICAL DATA:  Witnessed mechanical fall with positive head strike, no loss of consciousness, posterior scalp laceration EXAM: CT HEAD WITHOUT CONTRAST CT CERVICAL SPINE WITHOUT CONTRAST TECHNIQUE: Multidetector CT imaging of the head and cervical spine was performed following the standard protocol without intravenous contrast. Multiplanar CT image reconstructions of the cervical spine were also generated. COMPARISON:  None. FINDINGS: CT HEAD FINDINGS Brain: No evidence of acute infarction, hemorrhage, hydrocephalus, extra-axial collection, visible mass lesion or mass effect. Symmetric prominence of the ventricles, cisterns and sulci compatible with parenchymal volume loss. Patchy areas of white matter hypoattenuation are most compatible with chronic microvascular angiopathy. Vascular: Atherosclerotic calcification of the carotid siphons and intradural vertebral arteries. No  hyperdense vessel. Skull: Right parietal scalp swelling and hematoma with overlying surgical staple repair of an a laceration. Crescentic scalp hematoma measures up to a maximal 6 mm in thickness. No subjacent calvarial fracture or other acute osseous injury is seen. No other significant sites of scalp swelling or hematoma. Sinuses/Orbits: Round calcification in the right orbit appears to be extraconal, without particularly aggressive or worrisome features. Bilateral lens extractions. Orbital structures are otherwise unremarkable. Paranasal sinuses and mastoid air cells are predominantly clear albeit with some asymmetric hypopneumatization of the left mastoid. Other: Bilateral TMJ arthrosis CT CERVICAL SPINE FINDINGS Alignment: Stabilization collar is absent at the time of examination. Mild levoconvex curvature of the cervical spine. No evidence of traumatic listhesis. No abnormally widened, perched or jumped facets. Normal alignment of the craniocervical and atlantoaxial articulations. Bony fusion of the left C5-6 articular facets. Skull base and vertebrae: No acute skull base fracture. No vertebral body fracture or height loss. Normal bone mineralization. No worrisome osseous lesions. Moderate arthrosis at the atlantodental interval Soft tissues and spinal canal: No pre or paravertebral fluid or swelling. No visible canal hematoma. Airways patent. Cervical carotid and vertebral artery atherosclerosis. Disc levels: Multilevel intervertebral disc height loss with spondylitic endplate changes. Posterior disc osteophyte complexes throughout the cervical spine partially efface the ventral thecal sac without significant central cord impingement.  Multilevel uncinate spurring facet hypertrophic changes are present throughout the cervical levels as well resulting in mild-to-moderate multilevel neural foraminal narrowing with more moderate to severe narrowing on the right C3-4 and left C4-5. Upper chest: Extensive biapical  pleuroparenchymal scarring calcification. Calcifications in the proximal great vessels as well. Diminutive thyroid without concerning nodules or masses. Other: None. IMPRESSION: 1. Right parietal scalp swelling and hematoma with overlying surgical staple repair of an laceration. Crescentic scalp hematoma measures up to a maximal 6 mm in thickness. No subjacent calvarial fracture or other acute osseous injury is seen. 2. No acute intracranial abnormality. 3. Nonspecific calcification in the posterior right orbit without aggressive or worrisome features. 4. No acute fracture or traumatic listhesis of the cervical spine. 5. Multilevel degenerative changes of the cervical spine as described above. 6. Cervical and intracranial atherosclerosis. Electronically Signed   By: Kreg Shropshire M.D.   On: 10/05/2020 00:39   CT Cervical Spine Wo Contrast  Result Date: 10/05/2020 CLINICAL DATA:  Witnessed mechanical fall with positive head strike, no loss of consciousness, posterior scalp laceration EXAM: CT HEAD WITHOUT CONTRAST CT CERVICAL SPINE WITHOUT CONTRAST TECHNIQUE: Multidetector CT imaging of the head and cervical spine was performed following the standard protocol without intravenous contrast. Multiplanar CT image reconstructions of the cervical spine were also generated. COMPARISON:  None. FINDINGS: CT HEAD FINDINGS Brain: No evidence of acute infarction, hemorrhage, hydrocephalus, extra-axial collection, visible mass lesion or mass effect. Symmetric prominence of the ventricles, cisterns and sulci compatible with parenchymal volume loss. Patchy areas of white matter hypoattenuation are most compatible with chronic microvascular angiopathy. Vascular: Atherosclerotic calcification of the carotid siphons and intradural vertebral arteries. No hyperdense vessel. Skull: Right parietal scalp swelling and hematoma with overlying surgical staple repair of an a laceration. Crescentic scalp hematoma measures up to a maximal 6  mm in thickness. No subjacent calvarial fracture or other acute osseous injury is seen. No other significant sites of scalp swelling or hematoma. Sinuses/Orbits: Round calcification in the right orbit appears to be extraconal, without particularly aggressive or worrisome features. Bilateral lens extractions. Orbital structures are otherwise unremarkable. Paranasal sinuses and mastoid air cells are predominantly clear albeit with some asymmetric hypopneumatization of the left mastoid. Other: Bilateral TMJ arthrosis CT CERVICAL SPINE FINDINGS Alignment: Stabilization collar is absent at the time of examination. Mild levoconvex curvature of the cervical spine. No evidence of traumatic listhesis. No abnormally widened, perched or jumped facets. Normal alignment of the craniocervical and atlantoaxial articulations. Bony fusion of the left C5-6 articular facets. Skull base and vertebrae: No acute skull base fracture. No vertebral body fracture or height loss. Normal bone mineralization. No worrisome osseous lesions. Moderate arthrosis at the atlantodental interval Soft tissues and spinal canal: No pre or paravertebral fluid or swelling. No visible canal hematoma. Airways patent. Cervical carotid and vertebral artery atherosclerosis. Disc levels: Multilevel intervertebral disc height loss with spondylitic endplate changes. Posterior disc osteophyte complexes throughout the cervical spine partially efface the ventral thecal sac without significant central cord impingement. Multilevel uncinate spurring facet hypertrophic changes are present throughout the cervical levels as well resulting in mild-to-moderate multilevel neural foraminal narrowing with more moderate to severe narrowing on the right C3-4 and left C4-5. Upper chest: Extensive biapical pleuroparenchymal scarring calcification. Calcifications in the proximal great vessels as well. Diminutive thyroid without concerning nodules or masses. Other: None. IMPRESSION: 1.  Right parietal scalp swelling and hematoma with overlying surgical staple repair of an laceration. Crescentic scalp hematoma measures up to a maximal 6  mm in thickness. No subjacent calvarial fracture or other acute osseous injury is seen. 2. No acute intracranial abnormality. 3. Nonspecific calcification in the posterior right orbit without aggressive or worrisome features. 4. No acute fracture or traumatic listhesis of the cervical spine. 5. Multilevel degenerative changes of the cervical spine as described above. 6. Cervical and intracranial atherosclerosis. Electronically Signed   By: Kreg Shropshire M.D.   On: 10/05/2020 00:39   CT Hip Right Wo Contrast  Result Date: 10/05/2020 CLINICAL DATA:  87 year old female with concern for right hip fracture. EXAM: CT OF THE RIGHT HIP WITHOUT CONTRAST TECHNIQUE: Multidetector CT imaging of the right hip was performed according to the standard protocol. Multiplanar CT image reconstructions were also generated. COMPARISON:  Pelvic radiograph dated 10/04/2020. FINDINGS: Bones/Joint/Cartilage There is a nondisplaced fracture of the right femoral neck with mild impaction. The bones are osteopenic. No dislocation. Mild arthritic changes of the right hip. No joint effusion. Ligaments Suboptimally assessed by CT. Muscles and Tendons No acute findings.  No intramuscular fluid collection or hematoma. Soft tissues Diffuse colonic diverticulosis. The remainder of the soft tissues are unremarkable. IMPRESSION: Nondisplaced fracture of the right femoral neck. Electronically Signed   By: Elgie Collard M.D.   On: 10/05/2020 02:38     Labs:   Basic Metabolic Panel: Recent Labs  Lab 10/05/20 0022 10/06/20 0241 10/07/20 0249 10/08/20 0328  NA 137 139 135 138  K 4.5 4.1 4.6 4.3  CL 100 104 103 106  CO2 26 28 20* 26  GLUCOSE 122* 97 188* 146*  BUN 29* 16 14 14   CREATININE 0.84 0.62 0.70 0.61  CALCIUM 8.4* 8.4* 7.9* 8.4*  MG  --  2.2 2.1 1.9  PHOS  --  3.1 3.3 2.3*    GFR Estimated Creatinine Clearance: 25.6 mL/min (by C-G formula based on SCr of 0.61 mg/dL). Liver Function Tests: Recent Labs  Lab 10/07/20 0249 10/08/20 0328  ALBUMIN 2.8* 2.8*   No results for input(s): LIPASE, AMYLASE in the last 168 hours. No results for input(s): AMMONIA in the last 168 hours. Coagulation profile Recent Labs  Lab 10/05/20 0022  INR 1.1    CBC: Recent Labs  Lab 10/05/20 0022 10/06/20 0241 10/07/20 0249 10/08/20 0328  WBC 7.4 5.9 7.1 9.6  NEUTROABS 4.8  --   --   --   HGB 11.6* 11.5* 10.7* 10.2*  HCT 36.1 36.6 35.1* 31.2*  MCV 97.8 97.6 102.9* 95.4  PLT 175 176 140* 133*   Cardiac Enzymes: No results for input(s): CKTOTAL, CKMB, CKMBINDEX, TROPONINI in the last 168 hours. BNP: Invalid input(s): POCBNP CBG: No results for input(s): GLUCAP in the last 168 hours. D-Dimer No results for input(s): DDIMER in the last 72 hours. Hgb A1c No results for input(s): HGBA1C in the last 72 hours. Lipid Profile No results for input(s): CHOL, HDL, LDLCALC, TRIG, CHOLHDL, LDLDIRECT in the last 72 hours. Thyroid function studies Recent Labs    10/06/20 0241  TSH 2.469   Anemia work up Recent Labs    10/08/20 0328  VITAMINB12 142*  FOLATE 11.1  FERRITIN 191  TIBC 246*  IRON 27*  RETICCTPCT 0.8   Microbiology Recent Results (from the past 240 hour(s))  Resp Panel by RT-PCR (Flu A&B, Covid) Nasopharyngeal Swab     Status: None   Collection Time: 10/05/20  1:45 AM   Specimen: Nasopharyngeal Swab; Nasopharyngeal(NP) swabs in vial transport medium  Result Value Ref Range Status   SARS Coronavirus 2 by RT PCR  NEGATIVE NEGATIVE Final    Comment: (NOTE) SARS-CoV-2 target nucleic acids are NOT DETECTED.  The SARS-CoV-2 RNA is generally detectable in upper respiratory specimens during the acute phase of infection. The lowest concentration of SARS-CoV-2 viral copies this assay can detect is 138 copies/mL. A negative result does not preclude  SARS-Cov-2 infection and should not be used as the sole basis for treatment or other patient management decisions. A negative result may occur with  improper specimen collection/handling, submission of specimen other than nasopharyngeal swab, presence of viral mutation(s) within the areas targeted by this assay, and inadequate number of viral copies(<138 copies/mL). A negative result must be combined with clinical observations, patient history, and epidemiological information. The expected result is Negative.  Fact Sheet for Patients:  BloggerCourse.com  Fact Sheet for Healthcare Providers:  SeriousBroker.it  This test is no t yet approved or cleared by the [place] FDA and  has been authorized for detection and/or diagnosis of SARS-CoV-2 by FDA under an Emergency Use Authorization (EUA). This EUA will remain  in effect (meaning this test can be used) for the duration of the COVID-19 declaration under Section 564(b)(1) of the Act, 21 U.S.C.section 360bbb-3(b)(1), unless the authorization is terminated  or revoked sooner.       Influenza A by PCR NEGATIVE NEGATIVE Final   Influenza B by PCR NEGATIVE NEGATIVE Final    Comment: (NOTE) The Xpert Xpress SARS-CoV-2/FLU/RSV plus assay is intended as an aid in the diagnosis of influenza from Nasopharyngeal swab specimens and should not be used as a sole basis for treatment. Nasal washings and aspirates are unacceptable for Xpert Xpress SARS-CoV-2/FLU/RSV testing.  Fact Sheet for Patients: BloggerCourse.com  Fact Sheet for Healthcare Providers: SeriousBroker.it  This test is not yet approved or cleared by the [place] FDA and has been authorized for detection and/or diagnosis of SARS-CoV-2 by FDA under an Emergency Use Authorization (EUA). This EUA will remain in effect (meaning this test can be used) for the duration of  the COVID-19 declaration under Section 564(b)(1) of the Act, 21 U.S.C. section 360bbb-3(b)(1), unless the authorization is terminated or revoked.  Performed at [facility], [address], [place], [place] [address]      Discharge Instructions:   Discharge Instructions    Diet - low sodium heart healthy   Complete by: As directed    Discharge instructions   Complete by: As directed    Okay to leave dressing,once removed may shower without covering the incision.  [facility] in 4 weeks.  Continue physical therapy at home.  Follow-up with your primary care physician in 1 week.   Increase activity slowly   Complete by: As directed    No wound care   Complete by: As directed      Allergies as of 10/08/2020      Reactions   Morphine And Related    Penicillins    Shellfish Allergy       Medication List    TAKE these medications   acetaminophen 325 MG tablet Commonly known as: TYLENOL Take 2 tablets (650 mg total) by mouth every 6 (six) hours as needed for mild pain, fever or headache.   aspirin 81 MG chewable tablet Chew 1 tablet (81 mg total) by mouth 2 (two) times daily for 27 days.   atorvastatin 20 MG tablet Commonly known as: LIPITOR Take 1 tablet by mouth at bedtime.   ergocalciferol 1.25 MG (50000 UT) capsule Commonly known as: VITAMIN D2  Take 1 capsule by mouth See admin instructions. Every 12 weeks   ferrous sulfate 325 (65 FE) MG tablet Take 1 tablet (325 mg total) by mouth 3 (three) times daily after meals.   furosemide 20 MG tablet Commonly known as: LASIX Take 20 mg by mouth daily.   HYDROcodone-acetaminophen 5-325 MG tablet Commonly known as: NORCO/VICODIN Take 1 tablet by mouth every 8 (eight) hours as needed for moderate pain (pain).   lisinopril 10 MG tablet Commonly known as: ZESTRIL Take 10 mg by mouth daily.   methocarbamol 500 MG tablet Commonly known as: ROBAXIN Take 1 tablet (500 mg total) by  mouth every 6 (six) hours as needed for muscle spasms.   metoprolol tartrate 100 MG tablet Commonly known as: LOPRESSOR Take 100 mg by mouth 2 (two) times daily.   mirtazapine 7.5 MG tablet Commonly known as: REMERON Take 7.5 mg by mouth at bedtime.   Orencia 250 MG injection Generic drug: abatacept Inject 250 mg into the vein every 28 (twenty-eight) days. Every 4 weeks   senna-docusate 8.6-50 MG tablet Commonly known as: Senokot-S Take 1 tablet by mouth 2 (two) times daily as needed for moderate constipation.            Durable Medical Equipment  (From admission, onward)         Start     Ordered   10/07/20 1206  For home use only DME wheelchair cushion (seat and back)  Once        10/07/20 1205   10/07/20 1205  For home use only DME lightweight manual wheelchair with seat cushion  Once       Comments: Patient suffers from right hip fracture and arthritic deformities in hand which impairs their ability to perform daily activities like bathing and dressing in the home.  A cane, crutch, or walker will not resolve  issue with performing activities of daily living. A wheelchair will allow patient to safely perform daily activities. Patient is not able to propel themselves in the home using a standard weight wheelchair due to arm weakness, endurance, and general weakness. Patient can self propel in the lightweight wheelchair. Length of need 6 months . Accessories: elevating leg rests (ELRs), wheel locks, extensions and anti-tippers.   10/07/20 1205          Follow-up Information    Durene Romans, MD. Schedule an appointment as soon as possible for a visit in 2 weeks.   Specialty: Orthopedic Surgery Contact information: [address] [place] [address] [place] [address] [phone]        [facility] Follow up.   Specialty: Home Health Services Why: You will receive services out of the [facility] branch/office of [facility]. PT and OT. Contact  information: [address] [place] [address] [phone]        primary care provider Follow up in 1 week(s).   Why: regular followup               Time coordinating discharge: 39 minutes  Signed:  Laxman Pokhrel  [facility] 10/08/2020, 10:07 AM

## 2020-10-08 NOTE — TOC Transition Note (Signed)
Transition of Care [facility]) - CM/SW Discharge Note  Patient Details  Name: Kristen Lamb MRN: [id number] Date of Birth: 12-12-1929  Transition of Care [facility]) CM/SW Contact:  Ewing Schlein, LCSW Phone Number: 10/08/2020, 10:26 AM  Clinical Narrative: Patient medically ready for discharge. HH orders for OT and PT have been placed. CSW updated Kathlene November with [facility]. TOC signing off.  Final next level of care: Home w Home Health Services Barriers to Discharge: Barriers Resolved  Patient Goals and CMS Choice Patient states their goals for this hospitalization and ongoing recovery are:: Discharge to home in [place] with [facility] CMS Medicare.gov Compare Post Acute Care list provided to:: Patient Choice offered to / list presented to : Patient  Discharge Plan and Services In-house Referral: Clinical Social Work Post Acute Care Choice: Home Health          DME Arranged: N/A DME Agency: NA HH Arranged: PT,OT HH Agency: [facility] Date HH Agency Contacted: 10/07/20 Time HH Agency Contacted: [phone] Representative spoke with at [facility] Agency: Kathlene November  Readmission Risk Interventions No flowsheet data found.

## 2020-10-08 NOTE — Progress Notes (Addendum)
   Subjective: 2 Days Post-Op Procedure(s) (LRB): Right Femural Neck Fracture (Right) Patient reports pain as mild.   Patient seen in rounds for Dr. Charlann Boxer. Patient is well, and has had no acute complaints or problems. She is resting at the edge of the bed on exam. She denies pain in the hip. She reports she typically takes Prednisone 5 mg daily as needed for rheumatoid arthritis/pain, which she has not been getting here. Foley catheter removed, voiding without difficulty. Ambulated 50 feet with PT yesterday.  We will continue therapy today.   Objective: Vital signs in last 24 hours: Temp:  [97.6 F (36.4 C)-98.2 F (36.8 C)] 98 F (36.7 C) (03/23 0530) Pulse Rate:  [74-84] 78 (03/23 0530) Resp:  [16-17] 16 (03/23 0530) BP: (128-139)/(46-79) 139/79 (03/23 0530) SpO2:  [99 %] 99 % (03/23 0530)  Intake/Output from previous day:  Intake/Output Summary (Last 24 hours) at 10/08/2020 0913 Last data filed at 10/08/2020 0825 Gross per 24 hour  Intake 720 ml  Output 1300 ml  Net -580 ml     Intake/Output this shift: Total I/O In: -  Out: 200 [Urine:200]  Labs: Recent Labs    10/06/20 0241 10/07/20 0249 10/08/20 0328  HGB 11.5* 10.7* 10.2*   Recent Labs    10/07/20 0249 10/08/20 0328  WBC 7.1 9.6  RBC 3.41* 3.27*  3.25*  HCT 35.1* 31.2*  PLT 140* 133*   Recent Labs    10/07/20 0249 10/08/20 0328  NA 135 138  K 4.6 4.3  CL 103 106  CO2 20* 26  BUN 14 14  CREATININE 0.70 0.61  GLUCOSE 188* 146*  CALCIUM 7.9* 8.4*   No results for input(s): LABPT, INR in the last 72 hours.  Exam: General - Patient is Alert and Oriented Extremity - Neurologically intact Sensation intact distally Intact pulses distally Dorsiflexion/Plantar flexion intact Dressing - dressing C/D/I Motor Function - intact, moving foot and toes well on exam.   Past Medical History:  Diagnosis Date  . A-fib (HCC)   . CHF (congestive heart failure) (HCC)   . Complication of anesthesia     difficulty awakening   . Hypertension   . Rheumatoid arthritis (HCC)   . Stroke [facility])     Assessment/Plan: 2 Days Post-Op Procedure(s) (LRB): Right Femural Neck Fracture (Right) Active Problems:   Closed right hip fracture, initial encounter (HCC)   Fall from ground level   Essential hypertension   Atrial fibrillation, chronic (HCC)   Scalp hematoma, initial encounter   S/P total right hip arthroplasty   Protein-calorie malnutrition, severe  Estimated body mass index is 14.17 kg/m as calculated from the following:   Height as of this encounter: 5\' 1"  (1.549 m).   Weight as of this encounter: 34 kg. Advance diet Up with therapy  DVT Prophylaxis - Aspirin 81 mg BID x4 weeks.  Weight bearing as tolerated.  Plan is to go Home after hospital stay. Plan for discharge when meeting goals with PT and when cleared medically. We discussed discharge instructions, and I included the key information she needs at the top of her discharge instructions. She does not need to return to have stitches removed. She may keep the bandage on for 12 days, and then remove. Once removed, she may shower without covering the incision. She will call to schedule a follow up in the next 4-12 weeks.   , PA-C Orthopedic Surgery [phone] 10/08/2020, 9:13 AM

## 2021-08-08 IMAGING — CT CT HEAD W/O CM
3 series · 14 of 47 positions shown, 16 images · non-contrast
Comparison: None.

CLINICAL DATA: Witnessed mechanical fall with positive head strike,
no loss of consciousness, posterior scalp laceration

EXAM:
CT HEAD WITHOUT CONTRAST
CT CERVICAL SPINE WITHOUT CONTRAST
TECHNIQUE: Multidetector CT imaging of the head and cervical spine was
performed following the standard protocol without intravenous
contrast. Multiplanar CT image reconstructions of the cervical spine
were also generated.

[Series 3: head wo · axial · 0.39mm/px · z∈[-155,-30]mm · 8 of 31 slices shown, 10 images]
[im 3/31  brain]
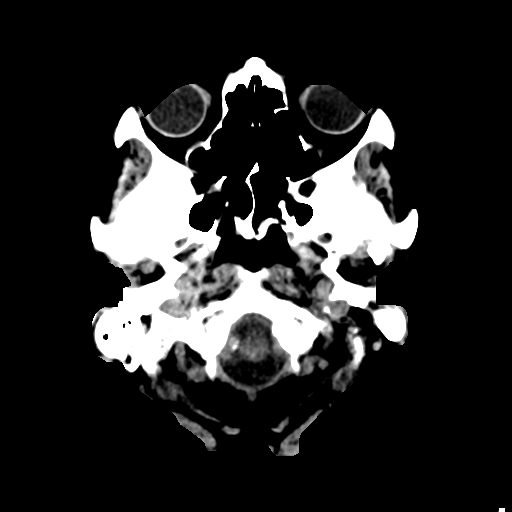
[im 3/31  bone]
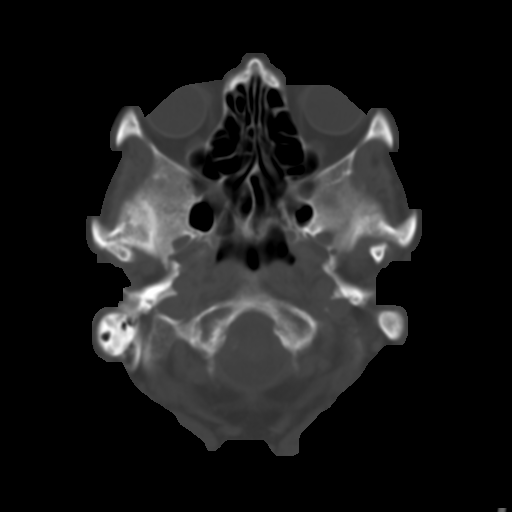
[im 7/31  brain]
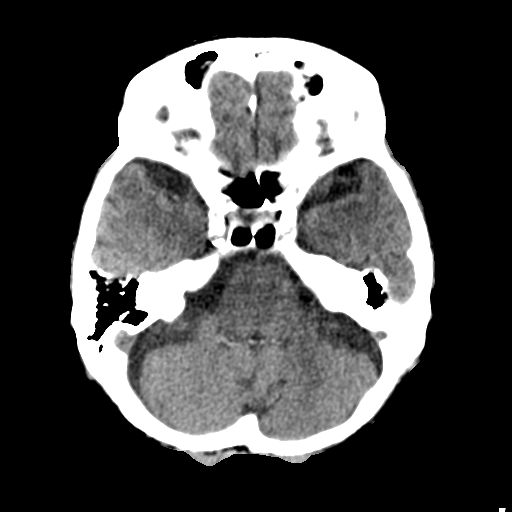
[im 10/31  brain]
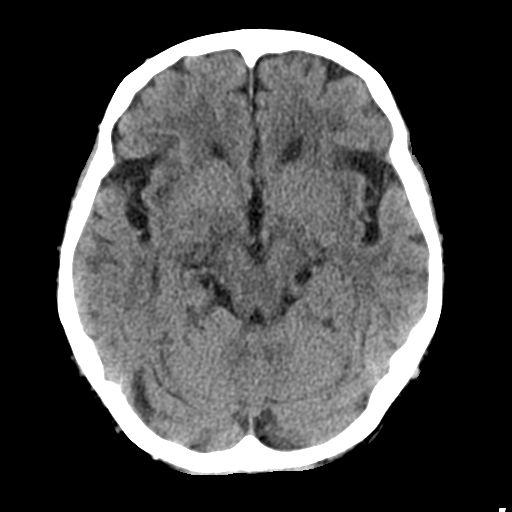
[im 14/31  brain]
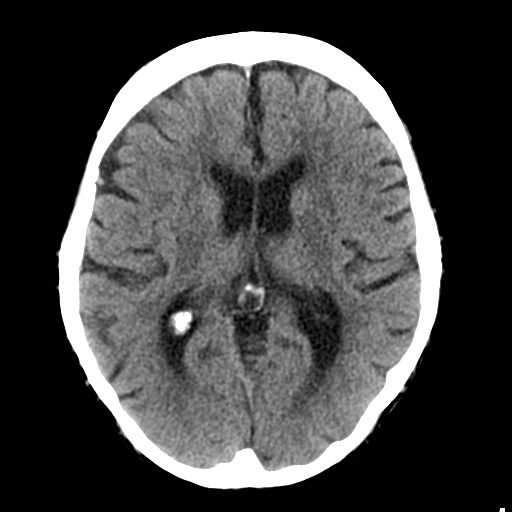
[im 17/31  brain]
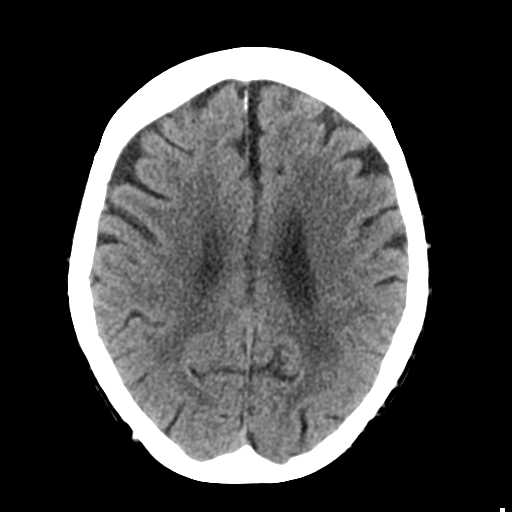
[im 17/31  bone]
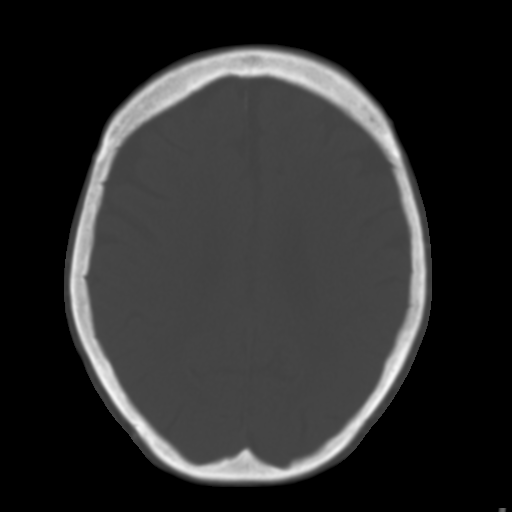
[im 21/31  brain]
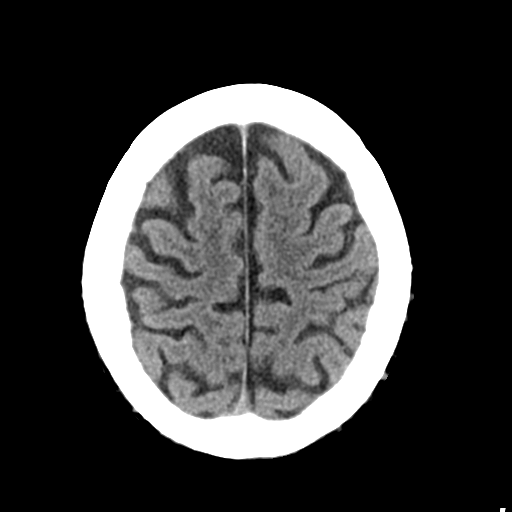
[im 24/31  brain]
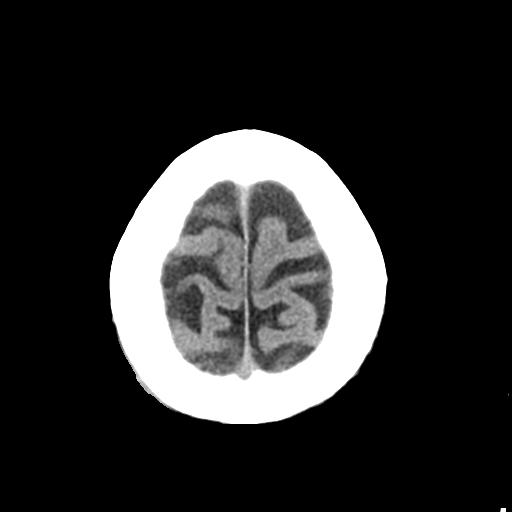
[im 28/31  brain]
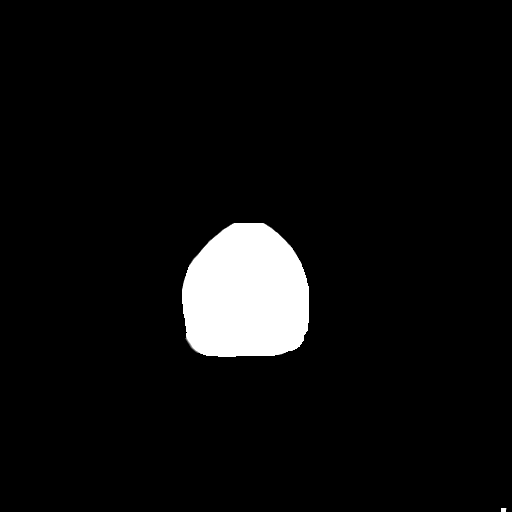

[Series 6: coronal soft tissue · coronal · 0.32mm/px · 3 of 66 slices shown]
[im 22/66  brain]
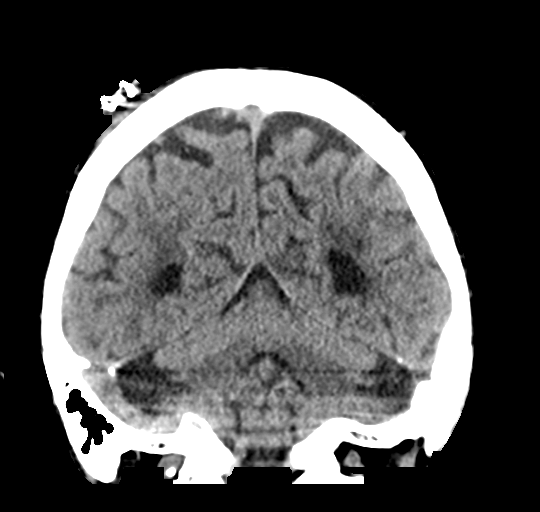
[im 29/66  brain]
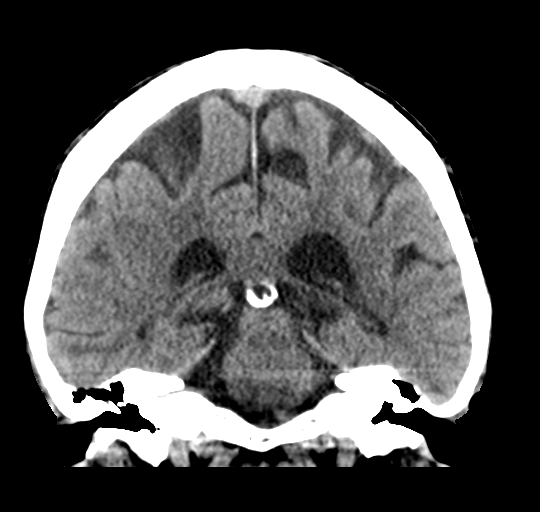
[im 37/66  brain]
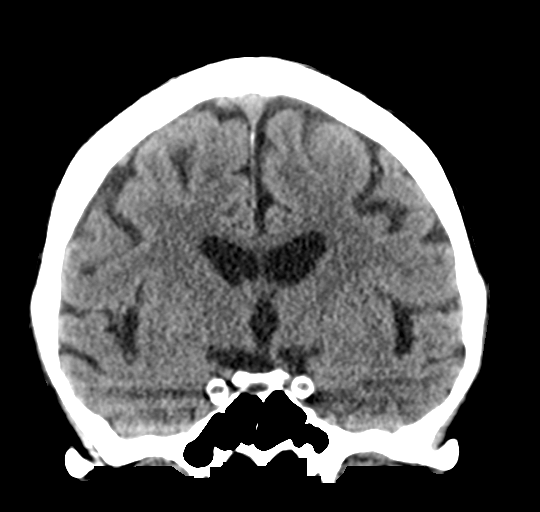

[Series 7: sagittal soft tissue · sagittal · 0.34mm/px · 3 of 55 slices shown]
[im 19/55  brain]
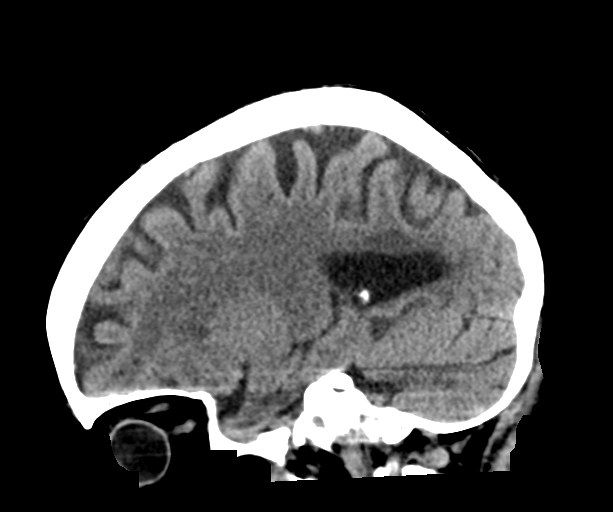
[im 28/55  brain]
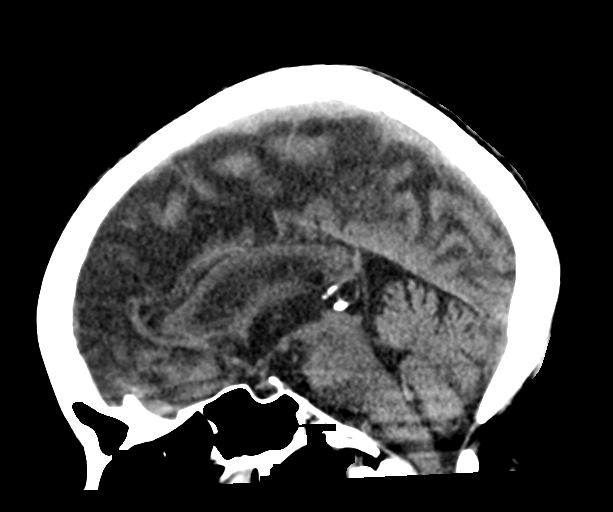
[im 37/55  brain]
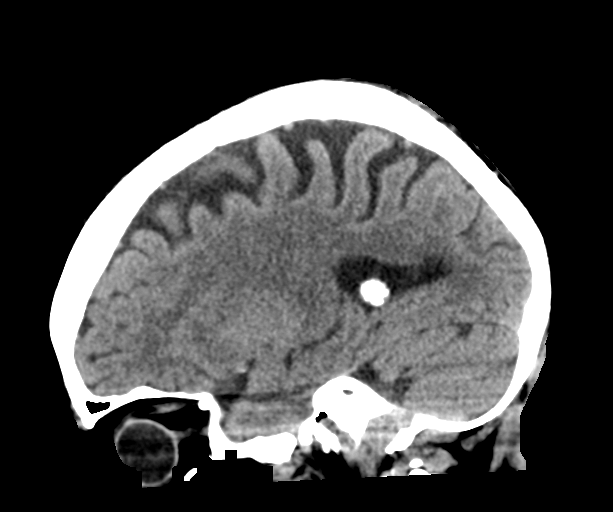

[14 of 47 positions shown; findings below may reference images not displayed]

FINDINGS: CT HEAD FINDINGS

Brain: No evidence of acute infarction, hemorrhage, hydrocephalus,
extra-axial collection, visible mass lesion or mass effect.
Symmetric prominence of the ventricles, cisterns and sulci
compatible with parenchymal volume loss. Patchy areas of white
matter hypoattenuation are most compatible with chronic
microvascular angiopathy.

Vascular: Atherosclerotic calcification of the carotid siphons and
intradural vertebral arteries. No hyperdense vessel.

Skull: Right parietal scalp swelling and hematoma with overlying
surgical staple repair of an a laceration. Crescentic scalp hematoma
measures up to a maximal 6 mm in thickness. No subjacent calvarial
fracture or other acute osseous injury is seen. No other significant
sites of scalp swelling or hematoma.

Sinuses/Orbits: Round calcification in the right orbit appears to be
extraconal, without particularly aggressive or worrisome features.
Bilateral lens extractions. Orbital structures are otherwise
unremarkable.

Paranasal sinuses and mastoid air cells are predominantly clear
albeit with some asymmetric hypopneumatization of the left mastoid.

Other: Bilateral TMJ arthrosis

CT CERVICAL SPINE FINDINGS

Alignment: Stabilization collar is absent at the time of
examination. Mild levoconvex curvature of the cervical spine. No
evidence of traumatic listhesis. No abnormally widened, perched or
jumped facets. Normal alignment of the craniocervical and
atlantoaxial articulations. Bony fusion of the left C5-6 articular
facets.

Skull base and vertebrae: No acute skull base fracture. No vertebral
body fracture or height loss. Normal bone mineralization. No
worrisome osseous lesions. Moderate arthrosis at the atlantodental
interval

Soft tissues and spinal canal: No pre or paravertebral fluid or
swelling. No visible canal hematoma. Airways patent. Cervical
carotid and vertebral artery atherosclerosis.

Disc levels: Multilevel intervertebral disc height loss with
spondylitic endplate changes. Posterior disc osteophyte complexes
throughout the cervical spine partially efface the ventral thecal
sac without significant central cord impingement. Multilevel
uncinate spurring facet hypertrophic changes are present throughout
the cervical levels as well resulting in mild-to-moderate multilevel
neural foraminal narrowing with more moderate to severe narrowing on
the right C3-4 and left C4-5.

Upper chest: Extensive biapical pleuroparenchymal scarring
calcification. Calcifications in the proximal great vessels as well.
Diminutive thyroid without concerning nodules or masses.

Other: None.
IMPRESSION: 1. Right parietal scalp swelling and hematoma with overlying
surgical staple repair of an laceration. Crescentic scalp hematoma
measures up to a maximal 6 mm in thickness. No subjacent calvarial
fracture or other acute osseous injury is seen.
2. No acute intracranial abnormality.
3. Nonspecific calcification in the posterior right orbit without
aggressive or worrisome features.
4. No acute fracture or traumatic listhesis of the cervical spine.
5. Multilevel degenerative changes of the cervical spine as
described above.
6. Cervical and intracranial atherosclerosis.
